# Patient Record
Sex: Male | Born: 1990 | Race: White | Marital: Single | Smoking: Never smoker
Health system: Southern US, Community
[De-identification: ages and names within clinical notes are randomized; demographics above are authoritative.]

## PROBLEM LIST (undated history)

## (undated) HISTORY — PX: TYMPANOSTOMY TUBE PLACEMENT: SHX32

---

## 2014-03-24 ENCOUNTER — Ambulatory Visit: Admit: 2014-03-24 | Discharge: 2014-03-24 | Disposition: A | Discharge status: Home / Self Care | Payer: Self-pay

## 2014-03-24 DIAGNOSIS — M25562 Pain in left knee: Secondary | ICD-10-CM

## 2014-03-24 LAB — HM HIV SCREENING OFFERED

## 2014-03-24 NOTE — UC Provider Note (Addendum)
History     Chief Complaint   Patient presents with    Knee Injury     Pt was rock climbing this evening and twisted left knee quickly causing a pop. Pt thinks that knee cap shifted while in the air, pt fell to ground and pt feels at that point the knee cap shifted back in place. Pain 4/10, no medication taken.       HPI Comments: 23 yo male c/o left knee pain s/p injury while climbing on rock wall at [employer].  Was reaching up while his knee was twisted, forced pressure onto his twisted knee, felt something pop, thinks his kneecap was out of place.  Larey SeatFell, landed on the ground, felt another pop.  Now knee is painful, hurts to bend.  Swollen.      Patient is a 23 y.o. male presenting with knee pain.   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:  Not work related  Knee Pain  Location:  Knee  Time since incident:  1 hour  Injury: yes    Mechanism of injury: fall    Fall:     Fall occurred:  Recreating/playing and jumping from height    Height of fall:  4 feet    Impact surface:  Athletic surface  Knee location:  L knee  Pain details:     Quality:  Aching    Radiates to:  Does not radiate    Severity:  Moderate    Onset quality:  Sudden    Timing:  Constant  Dislocation: yes (possible patella dislocation)    Foreign body present:  No foreign bodies  Prior injury to area:  No  Relieved by:  None tried  Worsened by:  Bearing weight and flexion  Associated symptoms: swelling    Associated symptoms: no fever        History reviewed. No pertinent past medical history.         Past Surgical History   Procedure Laterality Date    Tympanostomy tube placement         History reviewed. No pertinent family history.      Social History      reports that he has never smoked. He does not have any smokeless tobacco history on file. He reports that he drinks alcohol. He reports that he does not use illicit drugs. His sexual activity history is not on file.    Living Situation     Questions  Responses    Patient lives with Other(comment)    Comment: roomates     Homeless No    Caregiver for other family member No    External Services None    Employment Student    Domestic Violence Risk No          Review of Systems   Review of Systems   Constitutional: Negative for fever.   Eyes: Negative for discharge.   Respiratory: Negative for shortness of breath.    Cardiovascular: Negative for chest pain.   Musculoskeletal: Positive for joint swelling and arthralgias.   Skin: Positive for color change (bruising).   Neurological: Negative for dizziness.   Psychiatric/Behavioral: Negative for behavioral problems.       Physical Exam     ED Triage Vitals   BP Heart Rate Heart Rate(via Pulse Ox) Resp Temp Temp Source SpO2 O2 Device O2 Flow Rate   03/24/14 2038 03/24/14 2038 -- 03/24/14 2038 03/24/14 2038 03/24/14  2038 03/24/14 2038 03/24/14 2038 --   139/80 mmHg 77  18 36.4 C (97.6 F) Oral 97 % None (Room air)       Weight           03/24/14 2038           80.74 kg (178 lb)               Physical Exam   Constitutional: He is oriented to person, place, and time. He appears well-developed and well-nourished.   HENT:   Mouth/Throat: Oropharynx is clear and moist.   Eyes: Conjunctivae and EOM are normal.   Neck: Neck supple.   Cardiovascular: Normal rate and regular rhythm.    Pulmonary/Chest: Effort normal and breath sounds normal.   Musculoskeletal:        Left knee: He exhibits decreased range of motion (pain with flexion beyond 135 deg), swelling and effusion. He exhibits no deformity, no erythema, no LCL laxity, normal patellar mobility and no MCL laxity. Tenderness found. Medial joint line tenderness noted.   Lymphadenopathy:     He has no cervical adenopathy.   Neurological: He is alert and oriented to person, place, and time.   Skin: Skin is warm and dry.   Psychiatric: He has a normal mood and affect.     Xray show no acute fx or dislocation  Medical Decision Making        Initial Evaluation:  ED First Provider  Contact     Date/Time Event User Comments    03/24/14 2046 ED Provider First Contact Chaylee Ehrsam A Initial Face to Face Provider Contact          Patient seen by me as above    Assessment:  23 y.o., male [facility] with left knee pain s/p injury    Differential Diagnosis includes internal derangement, meniscus tear, patellar dislocation/reduction           Supervising physician Theophilus BonesKamali was immediately available     Plan: ice, ibuprofen, immobilization, crutches.  F/u with ortho.    Addendum: To clarify the medical record 03/30/2014 : Knee xray ordered due to pain and injury, results as above.  Knee immobilizer applied by nurse to left knee on 03/24/14, NVI post application.  Arlenis Blaydes A Jebadiah Imperato, PA

## 2014-03-24 NOTE — Discharge Instructions (Signed)
Rest your knee, ice, elevate, ibuprofen.  Keep immobilizer on until you follow up with ortho - you may take it off for bed and showers.  Follow up with orthopedics next week.

## 2014-03-24 NOTE — ED Notes (Signed)
Pt was rock climbing this evening and twisted left knee quickly causing a pop. Pt thinks that knee cap shifted while in the air, pt fell to ground and pt feels at that point the knee cap shifted back in place. Pain 4/10, no medication taken.  Devonne DoughtyMeghan Ester Hilley, RN

## 2014-03-25 ENCOUNTER — Ambulatory Visit: Payer: Self-pay | Admitting: Orthopedic Surgery

## 2014-03-25 ENCOUNTER — Encounter: Payer: Self-pay | Admitting: Orthopedic Surgery

## 2014-03-25 VITALS — Ht 71.0 in | Wt 178.0 lb

## 2014-03-25 DIAGNOSIS — S83005A Unspecified dislocation of left patella, initial encounter: Secondary | ICD-10-CM

## 2014-03-25 MED ORDER — HYDROCODONE-ACETAMINOPHEN 5-325 MG PO TABS *I*
1.0000 | ORAL_TABLET | Freq: Four times a day (QID) | ORAL | Status: DC | PRN
Start: 2014-03-25 — End: 2014-06-01

## 2014-03-25 NOTE — Progress Notes (Signed)
CHIEF COMPLAINT: Left knee injury    HISTORY OF PRESENT ILLNESS: Patient is a 23 year old otherwise healthy, active gentleman who presents today for evaluation of an injury to his left knee.  He states he was rockclimbing yesterday when he felt his left patella dislocate.  He did fall and felt his kneecap relocated.  He does not describe any previous problems with his left knee.  He was seen urgently and x-rays did reveal what appear to be ossific fragments in the patellofemoral compartment.  He was placed into a knee immobilizer, although he finds to be uncomfortable.  He has been taking ibuprofen with minimal symptomatic improvement.    PAST MEDICAL HISTORY: Denies any significant past medical history    PAST SURGICAL HISTORY: Tubes in the ears    MEDICATIONS:  See Medication List    ALLERGIES:  See Allergy List    SOCIAL HISTORY: He is a Consulting civil engineerstudent at [employer].  He does not smoke or use drugs.  He does drink alcohol    FAMILY HISTORY: Noncontributory    REVIEW OF SYSTEMS:  Review of Gastointestinal, Genitourinary, Neurologic, Integument, Vascular, Hematologic, Lymphatic, Cardiac, Pulmonary and Endocrine systems reveal the following: Swelling in multiple joints    PHYSICAL EXAM: His left knee today shows a small effusion.  There is tenderness in the peripatellar region.  There is no specific joint line tenderness.  I did not assess range of motion.  His calf is soft and nontender.  CMS checks are grossly intact distally.    IMAGING: I did review his x-rays.  These do show what appears to be ossific fragments in the anterior patellofemoral compartment consistent with an osteochondral injury from his patellar dislocation    ASSESSMENT AND DIAGNOSIS: Patellar dislocation with high suspicion for osteochondral injury    PLAN: Patient is referred for an MRI scan to better evaluate the osteochondral fragments.  She will followup shortly after the MRI.  He will call prior to that with any problems or questions.  He was given a  prescription for pain medication.  He'll continue with his ibuprofen, icing, and elevation.  He will continue with his knee immobilizer.  Hopefully we will expedite the MRI scan and see him back sometime next week for the results.  He will followup in the sports module.    ORDERS TODAY:    ORDERS NEXT VISIT:    PERCENT OF TEMPORARY IMPAIRMENT:

## 2014-03-25 NOTE — Patient Instructions (Signed)
Dear Zenia ResidesZachary Patrie,    Your physician has determined that you require durable medical equipment (DME) as a part of your treatment.  Knee braces, cast boots, walking boots, crutches, etc. Are DME.  These items offer protection and provide for your safety.  The type and quality of DME has been prescribed for you by your provider.      We cannot determine how much of the cost of this product will be paid by your insurance carrier.  Therefore, you may receive a bill for the outstanding balance.  It is your responsibility to pay whatever fee your insurance carrier does not.    DME Return Policy:     Braces and boots are not returnable if work outside of clinic due to hygiene concerns.   Poorly fitting braces can be exchanged for a correct fit within 1 week if the DME is in excellent condition.   DME that was not dispensed by [facility] will not be accepted.    If DME must be returned, it must be returned to the office that dispensed it.   Special order braces are billed at the time of order and are non-refundable.   Brace parts can be ordered and replaced if they become damaged or worn out.  This may include a charge.    Your type of brace: Other - 20 inch knee brace large     Patient Signature: __________________________  03/25/2014

## 2014-03-28 ENCOUNTER — Ambulatory Visit: Payer: Self-pay | Admitting: Sports Medicine

## 2014-03-28 ENCOUNTER — Encounter: Payer: Self-pay | Admitting: Orthopedic Surgery

## 2014-03-28 VITALS — BP 127/55 | Ht 71.0 in | Wt 178.0 lb

## 2014-03-28 DIAGNOSIS — T148XXA Other injury of unspecified body region, initial encounter: Secondary | ICD-10-CM

## 2014-03-28 NOTE — Patient Instructions (Signed)
Dear [name],    Your physician has determined that you require durable medical equipment (DME) as a part of your treatment.  Knee braces, cast boots, walking boots, crutches, etc. Are DME.  These items offer protection and provide for your safety.  The type and quality of DME has been prescribed for you by your provider.      We cannot determine how much of the cost of this product will be paid by your insurance carrier.  Therefore, you may receive a bill for the outstanding balance.  It is your responsibility to pay whatever fee your insurance carrier does not.    DME Return Policy:     Braces and boots are not returnable if work outside of clinic due to hygiene concerns.   Poorly fitting braces can be exchanged for a correct fit within 1 week if the DME is in excellent condition.   DME that was not dispensed by [facility] will not be accepted.    If DME must be returned, it must be returned to the office that dispensed it.   Special order braces are billed at the time of order and are non-refundable.   Brace parts can be ordered and replaced if they become damaged or worn out.  This may include a charge.    Your type of brace: T-ROM ($190)    Patient Signature: __________________________  03/28/2014

## 2014-03-28 NOTE — OR PreOp (Signed)
[facility] Pre-operative Guidelines              1. Where will you be staying when you are discharged after surgery-home  2.  How will you be getting there-girlfriend  3. Who will be taking care of you when you get there-girlfriend    Day of surgery arrival Time:  The surgery center will call you the business day before your procedure between 2-5 pm.     Eating and drinking guidelines:  No solid foods after midnight the night before your procedure.  Adults-  May have clear liquids (water, soda or apple juice only) up to 4 hours prior to your arrival time and then nothing by mouth.  Pediatrics-  All but dental patients may have clear liquids (water, soda, or apple juice only) up to 3 hours before arrival time.   Dental patients are NPO after midnight.  Breast fed infants may have breast milk up to 4 hours before arrival.  Formula fed infants may have formula up to 6 hours before arrival.    Medications:  Medications to be taken on the day of surgery:  none    Items to bring on the day of surgery:  Insurance card, photo ID and healthcare proxy.  If you use a CPAP for sleep apnea, please bring your mask with you.  Your crutches, knee brace or arm sling. Please leave your crutches in the car.    Clothing, Jewelry, valuables and eyeglass case:  Wear loose fitting clothing that will fit over a bulky dressing.  Leave all jewelry and valuables at home.  Bring your eyeglasses and eyeglass case. You cannot wear contact lenses.     Pediatrics:  A parent or legal guardian must accompany and remain in the building at all times. We recommend that two adults accompany the patient home while riding in a car; one to drive the vehicle and one to assist with the care of the child.   Bring the child’s favorite comfort item (i.e. blanket, doll or toy).    Surgeon Instructions:  Please read all pre-operative instructions carefully, and follow your surgeons guidelines if different from these instructions.

## 2014-03-28 NOTE — Progress Notes (Signed)
CHIEF COMPLAINT: Followup left knee    INTERVAL HISTORY: Patient returns now 3 days since his urgent care visit following his primary patellar dislocation.  There was clinical concern for an osteochondral fracture and he was referred for an MRI scan.  He returns today to discuss the results.  He indicates overall pain standpoint he is doing relatively well.  He is comfortable using crutches and a knee immobilizer.    PFSH:  Since last visit no change.    ROS:  Since last visit no change.    MEDICATION:  Since last visit no new meds.    ALLERGIES:  As per initial evaluation.    PHYSICAL EXAM: His left knee still shows soft tissue swelling and a small effusion.  Minimal tenderness at the joint lines.  His calf is soft and nontender.  CMS checks are grossly intact distally.    IMAGING: Dr. Milus Banisterouse and I did review the MRI scan.  This does show evidence of an osteochondral fracture involving large portion of the patellar articular cartilage and what appears to be an osteochondral injury to the periphery of the lateral femoral condyle.    ASSESSMENT: Primary patellar dislocation with osteochondral fragments    PLAN: Dr. Milus Banisterouse did meet with the patient.  Over 30 minutes was spent, the majority this was spent with education and counseling and reviewing the above.  Questions were invited and answered.  Patient understands no guarantees can be given as to the outcome.  At this point we'll plan for open reduction internal fixation of the fracture fragments from the patella.  We will plan for subsequent surgery for screw removal roughly 6 weeks following.  He will be in a TROM brace postoperatively.      ORDERS TODAY:    ORDERS NEXT VISIT:    PERCENT OF TEMPORARY IMPAIRMENT:

## 2014-03-31 NOTE — Progress Notes (Signed)
Jose Keller is seen and examined today in conjunction with Vladimir CreeksScott Kingsley for his left knee. History physical exam x-ray and MRI findings assessment and plan are as per his note. He is seen and MRI followup for her his first time patellar dislocations left knee.    Physical exam he does not have marked good patellar instability at risk anatomy. Q angle is 10 has minimal if any patella alter and has normal varus valgus alignment and does not have increased femoral anteversion. Does have medial facet tenderness. 2+ effusion. Flexion to 60 comfortably.    His MRI shows osteochondral and purely chondral fracturing involving central ridge and most of the medial facet of his patella. There's also some changes of the far rim of the lateral femoral condyle that is not articulate with the tibia in extension. Some of these defects or loose bodies some are still hinged full-thickness chondral flaps.    Impression extensive severe traumatic damage to the articular surfaces patella.    Plan long discussion was held with Ian MalkinZach and also his mother via cell phone regarding the severity of this injury. I've recommend acute surgical intervention which will consist of arthroscopic evaluation and removal of the loose bodies with plan for hopeful primary open reduction internal fixation of the pieces and flaps. 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 I discussed that any patellar dislocation is a risk factor for recurrence, but I do not feel his risk of patellar dislocation versus particularly high given his baseline anatomy as not bad. I discussed however that often indices patient's made to dislocate to have much more patellar trauma associated articular damage as is the case with him. He will require a second surgery remove the screws. I discussed in this interval we will try to minimize  patellofemoral contact pressure in a discussed biomechanics involved with that. Shows good understanding of all this. Anatomic models and drawings were used to explain the anatomy. I've added him on to the OR schedule this Friday. Risks benefits treatment options limitations reviewed. Questions invited and answered. Over 30 minutes was spent today with majority and education and counseling reviewing the above.

## 2014-03-31 NOTE — Anesthesia Preprocedure Evaluation (Addendum)
Anesthesia Pre-operative Evaluation for Jose Keller    ______________________________________________________________________________________  CPM Assessment Not Completed  Anesthesia Evaluation Information Source: records, patient     ANESTHESIA     Denies anesthesia history  Pertinent(-):  history of anesthetic complications    GENERAL     Denies general issues  Pertinent (-):  history of anesthetic complications    HEENT     Denies HEENT issues PULMONARY     Denies pulmonary issues  Pertinent(-): smoking, asthma, COPD    CARDIOVASCULAR     Denies cardiovascular issues  Excellent(10+METs) Exercise Tolerance  Pertinent(-):  hypertension    GI/HEPATIC/RENAL     Denies GI/hepatic/renal issues  Last PO Intake: Enter Last PO Intake in ROS/Med Hx Tab NEURO/PSYCH     Denies neuro/psych issues    ENDO/OTHER     Denies endo issues  Pertinent(-):  diabetes mellitus    HEMALOGIC     Denies hematologic issues       Physical Exam    Airway            Mouth opening: normal            Mallampati: I            TM distance (fb): >3 FB            Neck ROM: full            Airway Impression: easy  Dental   Normal Exam   Cardiovascular  Normal Exam           Rhythm: regular           Rate: normal       Pulmonary   Normal Exam    breath sounds clear to auscultation    Mental Status   Normal Exam    oriented to person, place and time     ________________________________________________________________________  Plan  ASA Score  1  Anesthetic Plan general    Induction (routine IV); General Anesthesia/Sedation Maintenance Plan (inhaled agents); Airway (LMA); Line ( use current access); Monitoring (standard ASA); Positioning (supine); PONV Plan (dexamethasone and ondansetron); Pain (per surgical team and nerve block); PostOp (PACU)    Informed Consent     Risks:          Risks discussed were commensurate with the plan listed above with the following specific points: N/V, sore throat and aspiration , damage to:(eyes, nerves, teeth),  allergic Rx, unexpected serious injury, awareness    Anesthetic Consent:      Anesthetic plan (and risks as noted above) were discussed with patient    Plan also discussed with team members including:  surgeon and CRNA    Attending Attestation:  As the primary attending anesthesiologist, I attest that the patient or proxy understands and accepts the risks and benefits of the anesthesia plan. I also attest that I have personally performed a pre-anesthetic examination and evaluation, and prescribed the anesthetic plan for this particular location within 48 hours prior to the anesthetic as documented.

## 2014-04-01 ENCOUNTER — Encounter: Payer: Self-pay | Admitting: Registered Nurse

## 2014-04-01 ENCOUNTER — Encounter: Payer: Self-pay | Admitting: Gastroenterology

## 2014-04-01 ENCOUNTER — Ambulatory Visit
Admit: 2014-04-01 | Discharge: 2014-04-01 | Disposition: A | Discharge status: Home / Self Care | Payer: Self-pay | Source: Ambulatory Visit | Attending: Sports Medicine | Admitting: Sports Medicine

## 2014-04-01 ENCOUNTER — Encounter
Disposition: A | Discharge status: Home / Self Care | Payer: Self-pay | Source: Ambulatory Visit | Attending: Sports Medicine

## 2014-04-01 HISTORY — PX: PR OPEN TX KNEE DISLOCATION W/REPAIR/RECONSTRUCTION: 27558

## 2014-04-01 HISTORY — PX: PR OPEN TX KNEE DISLOCATION W REPAIR/RECONSTRUCTION: 27558

## 2014-04-01 SURGERY — ORIF, PATELLA
Anesthesia: General | Site: Knee | Laterality: Left | Wound class: Clean

## 2014-04-01 MED ORDER — BUPIVACAINE-EPINEPHRINE 0.5 % IJ SOLUTION *WRAPPED*
INTRAMUSCULAR | Status: AC
Start: 2014-04-01 — End: 2014-04-01
  Filled 2014-04-01: qty 30

## 2014-04-01 MED ORDER — HALOPERIDOL LACTATE 5 MG/ML IJ SOLN *I*
1.0000 mg | Freq: Once | INTRAMUSCULAR | Status: DC | PRN
Start: 2014-04-01 — End: 2014-04-02
  Administered 2014-04-01: 1 mg via INTRAVENOUS

## 2014-04-01 MED ORDER — HYDROMORPHONE HCL PF 1 MG/ML IJ SOLN *WRAPPED*
0.5000 mg | INTRAMUSCULAR | Status: DC | PRN
Start: 2014-04-01 — End: 2014-04-02

## 2014-04-01 MED ORDER — KETOROLAC TROMETHAMINE 30 MG/ML IJ SOLN *I*
INTRAMUSCULAR | Status: DC | PRN
Start: 2014-04-01 — End: 2014-04-01
  Administered 2014-04-01: 30 mg via INTRAVENOUS

## 2014-04-01 MED ORDER — CEFAZOLIN SODIUM 1000 MG IJ SOLR *I*
2000.0000 mg | INTRAMUSCULAR | Status: AC
Start: 2014-04-01 — End: 2014-04-01
  Administered 2014-04-01: 2 g via INTRAVENOUS
  Administered 2014-04-01: 1 g via INTRAVENOUS

## 2014-04-01 MED ORDER — PROMETHAZINE HCL 25 MG/ML IJ SOLN *I*
6.2500 mg | INTRAMUSCULAR | Status: DC | PRN
Start: 2014-04-01 — End: 2014-04-02

## 2014-04-01 MED ORDER — HYDROMORPHONE HCL PF 1 MG/ML IJ SOLN *WRAPPED*
INTRAMUSCULAR | Status: AC
Start: 2014-04-01 — End: 2014-04-01
  Filled 2014-04-01: qty 1

## 2014-04-01 MED ORDER — ACETAMINOPHEN 10 MG/ML IV SOLN *I*
INTRAVENOUS | Status: DC | PRN
Start: 2014-04-01 — End: 2014-04-01
  Administered 2014-04-01: 1000 mg via INTRAVENOUS

## 2014-04-01 MED ORDER — LACTATED RINGERS IV SOLN *I*
20.0000 mL/h | INTRAVENOUS | Status: DC
Start: 2014-04-01 — End: 2014-04-02
  Administered 2014-04-01: 20 mL/h via INTRAVENOUS

## 2014-04-01 MED ORDER — ONDANSETRON HCL 2 MG/ML IV SOLN *I*
INTRAMUSCULAR | Status: AC
Start: 2014-04-01 — End: 2014-04-01
  Filled 2014-04-01: qty 2

## 2014-04-01 MED ORDER — DEXAMETHASONE SODIUM PHOSPHATE 4 MG/ML INJ SOLN *WRAPPED*
INTRAMUSCULAR | Status: DC | PRN
Start: 2014-04-01 — End: 2014-04-01
  Administered 2014-04-01: 4 mg via INTRAVENOUS

## 2014-04-01 MED ORDER — BUPIVACAINE-EPINEPHRINE 0.5 % IJ SOLUTION *WRAPPED*
INTRAMUSCULAR | Status: DC | PRN
Start: 2014-04-01 — End: 2014-04-01
  Administered 2014-04-01: 30 mL via PERINEURAL

## 2014-04-01 MED ORDER — KETOROLAC TROMETHAMINE 30 MG/ML IJ SOLN *I*
INTRAMUSCULAR | Status: AC
Start: 2014-04-01 — End: 2014-04-01
  Filled 2014-04-01: qty 1

## 2014-04-01 MED ORDER — MIDAZOLAM HCL 1 MG/ML IJ SOLN *I* WRAPPED
INTRAMUSCULAR | Status: DC | PRN
Start: 2014-04-01 — End: 2014-04-01
  Administered 2014-04-01: 2 mg via INTRAVENOUS

## 2014-04-01 MED ORDER — CEFAZOLIN SODIUM 1000 MG IJ SOLR *I*
INTRAMUSCULAR | Status: AC
Start: 2014-04-01 — End: 2014-04-01
  Filled 2014-04-01: qty 20

## 2014-04-01 MED ORDER — HYDROMORPHONE HCL PF 1 MG/ML IJ SOLN *WRAPPED*
INTRAMUSCULAR | Status: DC | PRN
Start: 2014-04-01 — End: 2014-04-01
  Administered 2014-04-01: 0.5 mg via INTRAVENOUS

## 2014-04-01 MED ORDER — LIDOCAINE HCL 1 % IJ SOLN *I*
INTRAMUSCULAR | Status: AC
Start: 2014-04-01 — End: 2014-04-01
  Filled 2014-04-01: qty 2

## 2014-04-01 MED ORDER — HALOPERIDOL LACTATE 5 MG/ML IJ SOLN *I*
INTRAMUSCULAR | Status: AC
Start: 2014-04-01 — End: 2014-04-01
  Filled 2014-04-01: qty 1

## 2014-04-01 MED ORDER — OXYCODONE HCL 5 MG/5ML PO SOLN *I*
10.0000 mg | Freq: Once | ORAL | Status: AC | PRN
Start: 2014-04-01 — End: 2014-04-01

## 2014-04-01 MED ORDER — ONDANSETRON HCL 2 MG/ML IV SOLN *I*
INTRAMUSCULAR | Status: DC | PRN
Start: 2014-04-01 — End: 2014-04-01
  Administered 2014-04-01: 4 mg via INTRAVENOUS

## 2014-04-01 MED ORDER — OXYCODONE HCL 5 MG/5ML PO SOLN *I*
5.0000 mg | Freq: Once | ORAL | Status: AC | PRN
Start: 2014-04-01 — End: 2014-04-01

## 2014-04-01 MED ORDER — FENTANYL CITRATE 50 MCG/ML IJ SOLN *WRAPPED*
INTRAMUSCULAR | Status: DC | PRN
Start: 2014-04-01 — End: 2014-04-01
  Administered 2014-04-01: 15:00:00 100 ug via INTRAVENOUS

## 2014-04-01 MED ORDER — BUPIVACAINE-EPINEPHRINE 0.25 % IJ SOLUTION *WRAPPED*
INTRAMUSCULAR | Status: DC | PRN
Start: 2014-04-01 — End: 2014-04-01
  Administered 2014-04-01: 30 mL via SUBCUTANEOUS

## 2014-04-01 MED ORDER — DEXAMETHASONE SODIUM PHOSPHATE 4 MG/ML INJ SOLN *WRAPPED*
INTRAMUSCULAR | Status: AC
Start: 2014-04-01 — End: 2014-04-01
  Filled 2014-04-01: qty 1

## 2014-04-01 MED ORDER — LIDOCAINE HCL 2 % (PF) IJ SOLN *I*
INTRAMUSCULAR | Status: AC
Start: 2014-04-01 — End: 2014-04-01
  Filled 2014-04-01: qty 4

## 2014-04-01 MED ORDER — PROPOFOL 10 MG/ML IV EMUL (INTERMITTENT DOSING) WRAPPED *I*
INTRAVENOUS | Status: DC | PRN
Start: 2014-04-01 — End: 2014-04-01
  Administered 2014-04-01: 230 mg via INTRAVENOUS

## 2014-04-01 MED ORDER — LIDOCAINE HCL 2 % IJ SOLN *I*
INTRAMUSCULAR | Status: DC | PRN
Start: 2014-04-01 — End: 2014-04-01
  Administered 2014-04-01: 80 mg via INTRAVENOUS

## 2014-04-01 MED ORDER — LIDOCAINE HCL 1 % IJ SOLN *I*
0.1000 mL | Freq: Once | INTRAMUSCULAR | Status: DC | PRN
Start: 2014-04-01 — End: 2014-04-02
  Administered 2014-04-01: 0.1 mL via SUBCUTANEOUS

## 2014-04-01 SURGICAL SUPPLY — 45 items
APPLICATOR CHLORAPREP 26ML ORANGE LARGE (Solution) ×4 IMPLANT
BANDAGE ELAST SLF-CLSR 4 X5.5 LF NONSTER (Dressing) ×2 IMPLANT
BANDAGE ELAST SLF-CLSR 6X11 LF NONSTER (Dressing) ×2 IMPLANT
BIT DRILL MINI QUICK COUPLING 1.5X65MM (Supply) ×4 IMPLANT
BLADE FULL RADIUS RESECTOR 3.5MM (Supply) ×2 IMPLANT
BLADE SURG CARBON STEEL #10 STER (Supply) ×4 IMPLANT
BLADE SURG CARBON STEEL #15 STER (Supply) ×8 IMPLANT
BLADE SURG CARBON STEEL #15 STER REUSE HNDL (Supply) ×4 IMPLANT
BOOT KNEE HIGH NON SKID (Supply) ×2 IMPLANT
BRACE BREG POST-OP KNEE SHT (Supply) IMPLANT
BUR CARBIDE MED RND 5MM LF (Other) IMPLANT
BUR CARBIDE RND 3MM LF (Other) IMPLANT
CLOSURE STERI-STRIP REINF .5 X 4IN LF (Dressing) ×2 IMPLANT
COUNTER NEEDLE ADH 20/40FM BLACK STER (Supply) IMPLANT
COVER C ARM W104XL213CM PNL 76X61CM FTSWCH W36XL76CM MINI FOR OEC 6800 (Drape) IMPLANT
COVER CAMERA LIGHT HANDLE DISP (Supply) ×2 IMPLANT
CUFF TOURNIQUET SPSB PLC 34IN STER DISP (Supply) ×2 IMPLANT
DRAPE C-ARM 42 IN X 120 IN S1 (Drape) IMPLANT
DRAPE MINI C-ARM (Drape)
DRAPE SHEET 70X100 (Drape) ×1
DRAPE SUR W70XL100IN STD SMS POLYPR FULL SHT W/O FLD PCH DISP (Drape) ×1 IMPLANT
DRESSING XEROFORM 5 IN X9 IN (Dressing) ×2 IMPLANT
GLOVE SURG PROTEXIS PI CLASSIC 8.5 PF SYN (Glove) ×2 IMPLANT
GLOVE SURG PROTEXIS PI CLASSIC 9.0 PF SYN (Glove) ×2 IMPLANT
GOWN SIRIUS PLY REINF BRTH SLV XL XLONG (Gown) ×4 IMPLANT
GOWN SIRIUS RAGLAN NONREINFORCED XL  USE 188665 (Gown) IMPLANT
KIT SKIN SCRUB (Pack) ×1
MAT SUCT FLOOR PUDDLE GUPPY (Supply) ×2 IMPLANT
PACK CUSTOM ACL CDS (Pack) ×2 IMPLANT
PADDING WEBRIL 4IN LF NONSTER (Dressing) ×2 IMPLANT
SCREW CORTEX SLF-TAP CRUCIFORM 2.0 14MM (Screw) ×8 IMPLANT
SET ARTHRSC CASSETTE TUBE (Supply) ×2 IMPLANT
SLEEVE COMP KNEE HI MED (Supply) ×2 IMPLANT
SOL LACT RINGER IRRIG 3000ML BAG (Drug) ×4 IMPLANT
SOL SODIUM CHLORIDE IRRIG 1000ML BTL (Solution) ×2 IMPLANT
SUTR ETHILON 4-0 PS-2 BLACK (Suture) IMPLANT
SUTR ETHILON MONO 3-0 PS-2 BLACK (Suture) IMPLANT
SUTR VICRYL ANTIB 1 CT-1 POP 18 VIOLET (Suture) ×2 IMPLANT
SUTR VICRYL ANTIB BRD 2-0 CP-2 18 UNDYED (Suture) ×4 IMPLANT
TIP SUCT FRAZIER VENT 10FR (Supply) ×2 IMPLANT
TRAY SCRUB DRY SKIN INCLUDES 6 WINGED SPONGES 6 SPONGE STICKS 2 COTTON TIP APPLICATORS ABSORBENT/BLOTTING TOWELS PREMIUM (Pack) ×1 IMPLANT
TUBING NONCONDUC CONN 12FT X 3/16IN (Tubing) ×2 IMPLANT
WAND ELIM WITH CABLE 4.5 X 90DEG (Supply) IMPLANT
WIRE K DBL TROCAR END SHT SMTH .028X4IN (Implant) ×2 IMPLANT
screw synthes 2.0mm cortex ×2 IMPLANT

## 2014-04-01 NOTE — Anesthesia Procedure Notes (Addendum)
----------------------------------------------------------------------------------------------------------------------------------------    Femoral Nerve Block    Date of Procedure: 04/01/2014 3:08 PM    Laterality:  Left    Injection Technique: Single-shot    Reason for Block: Post-op pain management and At Surgeon's request        Patient Location: Pre-op  CONSENT AND TIMEOUT     Consent:  Obtained per policy    Timeout: patient identified (name/DOB) , nerve block procedure/site/side verified against block consent form , nerve block procedure/site/side verified by patient or family, proper patient position verified, operative procedure/site/side verified by patient or family , block site initialed by attending, needed equipment, monitors, medications and access verified as present and functioning , operative procedure/side/site verified against surgical consent, allergies reviewed with patient/record , operative procedure/side/site verified against surgical schedule, all members of the block team participated in the timeout and anticoagulation/antiplatelet status reviewed  METHOD     Patient Position:  Supine    Monitoring:  Blood pressure and Continuous pulse ox with supplemental oxygen    Sedation Used:  Yes    Level of Sedation: Minimal              for meds injected see MAR portion of chart    Prep:  Aseptic technique per protocol and Chloraprep    Approach:  In-plane and Lateral    Technique: Ultrasound guided       Attempts:  1   NEEDLE     Type:  Short-bevel     Gauge: 22 G     Length: 5 cm  BLOCK EVENTS      No Paresthesia with needle     No Paresthesia with injection     No significant resistance to injection     No significant pain on injection     No blood aspirated     No intravascular injection     Well Tolerated  STAFF     Performed by: Extender under direct supervision    Attending Attestation: I was present for the entire procedure     Attending: Marcille BuffyYU, ALBERT  Extender: FICHTER,  JENNIFER  ----------------------------------------------------------------------------------------------------------------------------------------  ---------------------------------------------------------------------------------------------------------------------------------------  AIRWAY   GENERAL INFORMATION AND STAFF    Patient location during procedure: OR       Date of Procedure: 04/01/2014 3:52 PM  CONDITION PRIOR TO MANIPULATION     Current Airway/Neck Condition:  Normal        For more airway physical exam details, see Anesthesia PreOp Evaluation  AIRWAY METHOD     Preoxygenated: yes      Induction: IV  Mask Difficulty Assessment:  0 - not attempted    Number of Attempts at Approach:  1  FINAL AIRWAY DETAILS    Final Airway Type:  LMA    Final LMA: Unique    LMA Size: 4  ADDITIONAL COMMENTS   Soft bite block in.  ----------------------------------------------------------------------------------------------------------------------------------------

## 2014-04-01 NOTE — Discharge Instructions (Signed)
ACL Repair  Dr. Reginia FortsLucian Rouse  Department of Orthopedics  Phone - [phone]    Please follow the instructions next to any blank that is checked [x] .    ACTIVITY:  [x]  Rest today; tomorrow you may resume your usual activities if you received anesthesia care.  []  Use crutches, weight bearing as tolerated until comfortable then discontinue; bend and straighten your knee as tolerated  []  No weight bearing - use crutches.  [x]  Touch down weight bearing - use crutches (25% partial weight bearing with brace locked).    MEDICATIONS:  Resume your usual medication.  If you are taking prescribed pain medication, you should not drive, operate machinery, power tools, or drink any alcoholic beverages.     DRESSINGS:    [x]  Keep your dressing clean and dry until you see your doctor (first dressing change to be done by Physical Therapist).  []  Remove your dressing after 48 hours, then place Band-Aids over the incision.   [x]  Use an ice bag over your incision site for 24 hours.  Keep a washcloth between ice bag and cast/bandage to keep it dry.    SPECIAL INSTRUCTIONS:  [x]  You may take a shower after 1 week.  Do not soak in the tub, pool etc.  [x]  Remove drain 4 hours after discharge.  [x]  You may drive when your doctor tells you.    SURGERY TO A LIMB:  Keep limb elevated on 2-3 pillows so that it is above the level of your heart for 24 hours intermittently.  Wiggle toes of affected limb continually.    YOU SHOULD CALL YOUR DOCTOR FOR ANY OF THE FOLLOWING:  Fever of 101? or higher.  Redness, warmth and firmness around the incision.  Foul smelling drainage from incision or cast.  Pain that does not lessen with pain medication as prescribed by your doctor.  Persistent nausea or vomiting into the next day.  Bleeding or continuous oozing that saturates the bandage that does not stop after applying pressure to wound for 0 minutes.  Increased swelling of fingers, or severe tightness of bandage not relieved with elevation of  limb above the level of your heart.  Increased numbness or tingling.  Pale, blue or cold fingers/nail beds (compared to opposite side).  If you have not urinated within 6 hours after discharge.    Work/ School: Discuss with doctor at post -op visit.    Follow up care:  You should already have an appointment for your post operative visit at the doctors office.   If you do not, please call ASAP [phone]    In case of emergency in which you cannot reach your physician, please go directly to the nearest hospital emergency room department.          Due to the sedation medication and or general anesthesia you have been given today, please follow these discharge instructions:    [x]  Do not drive or operate any machinery for 24 hours or the specified time frame that was recommended by your doctor, please refer to post op instructions.  [x]  Do not drink any alcoholic beverages for 24 hours after your procedure and/or if you are taking a narcotic pain reliever (e.g. Vicodin, Percocet or Tylenol #3).  [x]  Do not make any major decisions or sign contracts for 24 hours.  [x]  Prescription information given to patient and/or patient representative.    Diet: begin with liquids, advance as tolerated.    Your last pain medication was given to you  at: Tylenol given @ 3:30pm                                                                              Toradol given @ 5:30pm    Your next dose of pain medication is due after: May have Toradol @ 11:30pm                                                                              [facility] @ 9:30pm    If unable to reach your doctor at [phone], call 911 for a true emergency or go to your nearest emergency department.    It is our recommendation that you have someone stay with you for 24 hours following your procedure.

## 2014-04-01 NOTE — H&P (Signed)
UPDATES TO PATIENT'S CONDITION on the DAY OF SURGERY/PROCEDURE    I. Updates to Patient's Condition (to be completed by a provider privileged to complete a H&P, following reassessment of the patient by the provider):    (Inpatients only): I confirm that progress notes within the past 24 hours document updates to the patient's condition.            II. Procedure Readiness   I have reviewed the patient's H&P and updated condition. By completing and signing this form, I attest that this patient is ready for surgery/procedure.      III. Attestation   I have reviewed the updated information regarding the patient's condition and it is appropriate to proceed with the planned surgery/procedure.    Norina BuzzardLUCIEN Robbert Langlinais, MD as of 3:05 PM 04/01/2014

## 2014-04-01 NOTE — Anesthesia Case Conclusion (Signed)
CASE CONCLUSION  Emergence  Actions:  Suctioned, soft bite block and LMA removed  Criteria Used for Airway Removal:  Adequate Tv & RR and acceptable O2 saturation  Assessment:  Routine  Transport  Directly to: PACU  Position:  Recumbent  Patient Condition on Handoff  Level of Consciousness:  Mildly sedated  Patient Condition:  Stable  Handoff Report to:  RN

## 2014-04-01 NOTE — INTERIM OP NOTE (Signed)
Interim Op Note (Surgical Log ID: [id number])       Date of Surgery: 04/01/2014       Surgeons: Surgeon(s) and Role:     Norina Buzzard* Rouse, Lucien, MD - Primary     * Nimrat Woolworth, Alphonzo DublinJon P, MD - Resident - Assisting       Pre-op Diagnosis: Pre-Op Diagnosis Codes:     * Dislocation of patella, left, subsequent encounter [S83.005D]     * Bodies, loose, joint, knee, left [M23.42]       Post-op Diagnosis: Post-Op Diagnosis Codes:     * Dislocation of patella, left, subsequent encounter [S83.005D]     * Bodies, loose, joint, knee, left [M23.42]       Procedure(s) Performed: Procedure:    LEFT KNEE ARTHROSCOPY AND OPEN REDUCTION INTERNAL FIXATION OF LEFT PATELLA  CPT(R) Code:  [id number] - PR OPEN TX KNEE DISLOCATION W REPAIR/RECONSTRUCTION       Additional CPT Codes:        Anesthesia Type: General        Fluid Totals: I/O this shift:  10/23 1500 - 10/23 2259  In: 1000 (12.6 mL/kg) [I.V.:1000]  Out: - (0 mL/kg)   Net: 1000  Weight: 79.4 kg        Estimated Blood Loss: No Data Recorded       Specimens to Pathology:  * No specimens in log *       Temporary Implants:        Packing:                 Patient Condition: good       Findings (Including unexpected complications): none     Signed:  Barrington EllisonJon P Lielle Vandervort, MD  on 04/01/2014 at 5:21 PM

## 2014-04-01 NOTE — Anesthesia Postprocedure Evaluation (Signed)
Anesthesia Post-Op Note    Patient: Jose ResidesZachary Keller    Procedure(s) Performed:  Procedure Summary     Date Anesthesia Start Anesthesia Stop Room / Location    04/01/14 1508 1807 [id number]       Procedure Diagnosis Surgeon Attending Anesthesia    LEFT KNEE ARTHROSCOPY with loose body removal AND OPEN REDUCTION INTERNAL FIXATION OF LEFT PATELLA (Left Knee) Dislocation of patella, left, subsequent encounter; Bodies, loose, joint, knee, left Norina Buzzardouse, Lucien, MD Lovena Neighbours, Lorraina  I, MD     (Dislocation of patella, left, subsequent encounter [S83.005D]; Bodies, loose, joint, knee, left [M23.42])          Anesthesia type:  General  Complications Noted (Any):  None No value filed.  Patient Location:  PACU  Level of Consciousness:    Recovered to baseline  Patient Participation:     Able to participate  Temperature Status:    Normothermic  Oxygen Saturation:    Within patient's normal range  Cardiac Status:   Within patient's normal range  Fluid Status:    Stable  Airway Patency:     Yes  Pulmonary Status:    Baseline  Pain Management:    Adequate analgesia  Nausea and Vomiting:    Controlled    Post Op Assessment:    Tolerated procedure well and no evidence of recall   Attending Attestation:  All indicated post anesthesia care provided

## 2014-04-02 ENCOUNTER — Telehealth: Payer: Self-pay | Admitting: Orthopedic Surgery

## 2014-04-02 NOTE — Telephone Encounter (Signed)
Patient called answering service concerned about his stitches. Now POD1 s/p left knee arthroscopy with fixation of a loose osteochondral patellar fragment after patellar dislocation. States that his stitches feel "tight" and that they may be pulling apart. Denies any significant increase in pain. Denies strikethrough drainage on his bandage. Reassured patient that what he is feeling may be normal postoperatively but should he develop any increase in drainage or substantial increase in pain that he present to ED or urgent care over the weekend, otherwise he can call Dr. Coralie Carpenouse's office on Monday to discuss any concerns. All patient's questions/concerns addressed to his satisfaction.    Jose ConnorsNathan Jacaria Colburn, MD  Orthopaedic Surgery  04/02/2014, 9:13 PM

## 2014-04-04 NOTE — Op Note (Signed)
Jose Keller:   Keller, Jose  MR #:  [id number]   ACCOUNT #:  [id number] DOB:  01/06/1991    AGE:  23     SURGEON:  Kristine LineaLucien M Brittain Hosie, MD  CO-SURGEON:    ASSISTANT:    SURGERY DATE:  04/01/2014    PREOPERATIVE DIAGNOSIS:    1. 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   2. Posttraumatic arthritis and loose bodies, left knee.    OPERATIVE PROCEDURE:    1. Open reduction and internal fixation of osteochondral fracture of patella (CPT [phone]).   2. Surgical arthroscopy with chondroplasty and debridement and removal of loose body (CPT [phone]).    ANESTHESIA:  General plus single-shot femoral nerve block.    COMPLICATIONS:  None.    OPERATIVE INDICATIONS:  This is a 23 year old male who sustained a patellar dislocation of his left knee.  MRI showed extensive articular damage to the patella with a large, hinged, fully chondral flap full-thickness but also associated osteochondral defect and associated loose bodies.  There is also a defect of the far lateral flare of the lateral femoral condyle.  Preoperatively, extensive consultation was held with the patient regarding risks, benefits, treatment options, and limitations.  We discussed that this does represent significant articular damage to the patella and that complete normalcy of this surface will not be obtained.  The plan is for open reduction and internal fixation of those as able given the pieces.  The plan will be for then removal of the screws after approximately 10 weeks postop.  Again, risks, benefits, treatment options, and limitations were reviewed.  Questions were invited and answered.  He desires we proceed.    DESCRIPTION OF PROCEDURE:  After appropriate consent was obtained, the patient was taken to the operating room, where excellent general anesthesia was administered.  The patient also had a single-shot femoral nerve block placed in the preanesthesia unit for  postoperative pain control.  The patient remained stable throughout the case.     The patient's left knee was examined under anesthesia.  There was found to be no ligamentous instability.     The patient's left lower extremity was then prepped and draped in the usual sterile manner.  A superomedial outflow portal was created, then anterolateral and anteromedial portals created.  Arthroscopic findings were as follows:  The suprapatellar pouch, medial and lateral gutters were without abnormalities.  The patellofemoral joint showed fairly mild lateral tracking and then became fully centered at 40 degrees in spite of his recent patellar dislocation.  There was again extensive damage to the patella.  The distal 2/3 of the medial facet was involved across the central ridge into the lateral facet.  There were multiple fragments of full-thickness chondral flaps and then also a large piece of this partly chondral/partly osteochondral completely missing as part of the loose body.  This loose body was identified and gently removed by enlarging the portal.  There was also associated grade 4 defect of the lateral femoral condyle, which just barely started to articulate in the posterior edge of the defect with the anterior rim of the lateral meniscus in full extension, thus not a major weightbearing portion, indicating the dislocation having occurred in not deep flexion.     The grossly unstable flaps of the lateral femoral condyle and non-repairable flaps of the patella were then debrided with a 3.5 full radius.     A medial parapatellar  incision was then made, and the distal quad medial retinaculum down to the medial border of the patellar tendon was incised to allow for the patella to be everted for exposure of the defect.  Again, there were multiple longitudinal fracture lines in the cartilage with a large flap that was full-thickness but further delamination partial-thickness shearing more proximally.  This part was  debrided.  The base was curetted to further stimulate healing.  The loose body was found to fit the more distal aspect of the defect.  Approximately half of this loose body had some bone.  Both the full-thickness chondral flap and the loose body were then carefully sized and fit, and then fixed with 2.0 mm screws from the [employer] hand modular pan.  This involved a 1.5 drill and 2.0 screws.  Three were placed along the primary central ridge fragment and two over the more secondary chondral fragment.  One of the medial facet screws was 11 mm; the others were 14 mm.  Good fixation and reduction was obtained.  These were all countersunk and did not create crepitation against the trochlea with range of motion including simulated compression.  The final irrigation was performed and then the medial arthrotomy repaired using #1 Vicryl.  The skin was closed with 2-0 Vicryl for subcu and a running subcuticular suture of 3-0 Monocryl for skin.  Sterile dressing was applied and the patient placed in a hinged knee brace locked in full extension.  The patient tolerated the procedure well and was taken to the recovery room in stable condition.             ______________________________  Kristine LineaLucien M Marranda Arakelian, MD    LMR/MODL  DD:  04/04/2014 07:33:59  DT:  04/04/2014 10:53:10  Job #:  [id number]    cc:

## 2014-04-05 ENCOUNTER — Ambulatory Visit: Payer: Self-pay | Admitting: Physical Therapy

## 2014-04-05 ENCOUNTER — Encounter: Payer: Self-pay | Admitting: Sports Medicine

## 2014-04-05 DIAGNOSIS — M25562 Pain in left knee: Secondary | ICD-10-CM

## 2014-04-05 NOTE — Progress Notes (Signed)
SPORTS PT CHARGE:   Evaluation:  PT CODE 3302 - CPT CODE PT - 97001 - Initial Eval (30 minutes)  Supervised Modalities:  PT CODE 203 - CPT CODE PT - 97016 -  Vasopneumatic devices  Therapeutic Procedures:  PT CODE 314 - CPT CODE PT - 97110 - Therapeutic Exercise (ea 15 min) x 1 unit  Total Minute Time:  60 min

## 2014-04-05 NOTE — Progress Notes (Signed)
[facility]   POST-OPERATIVE KNEE EVALUATION    Diagnosis: Left  patellar OCD fx ORIF    Date of Surgery: 04/01/14    History  Jose Keller is a 23 y.o. male who is present today for left knee care s/p left knee patellar OCD fx ORIF.  Pt. Reports that he was rock climbing when he felt his left patella dislocate.  He fell and landed directly on his knee  Pt reports pain is moderate.  Symptom location: Anterior, left  Relevant symptoms:  Aching, Pain , Decreased ROM, Decreased strength  Symptom frequency: Intermittent  Symptom intensity:  (0 - 10 scale): Now 5 Best 0 Worst 7   Night Pain: Yes   Restful sleep:   Yes  Morning Pain/Stiffness: Unchanged   Symptoms worsen with: NA  Symptoms improve with: Rest, Medication  Assistive device:  crutches  Patients goals for therapy: Return to work, Return to sport, Reduce pain, Increase ROM, Increase strength, Independent with home program  Post-op note reviewed: Yes    Occupation and Activities  Work status: Usual work  Therapist, 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, [employer]  Chiropractortresses/physical demands of job: Nurse, learning disabilityKeyboarding and Naval architectWriting  Stresses/physical demands of home: Self Care, Stage managerHousekeeping, Gardening/Yard Work and Sports  Sport(s): rock climbing       Objective    Observation: Edema, Effusion  Gait:  NWB     Lumbar Screen:  WNL  Neurologic:  NA    Palpation: swelling and tenderness  Incision:  No evidence of infection      ROM/Strength  Full strength/ROM assessment deferred secondary to acute post-op status    KNEE LEFT RIGHT STRENGTH    PROM AROM PROM AROM Left Right   Flexion 70    3 5   Extension 0    3 5            Quad Isometric (Quad set)     Poor Good   SLR     NA Good         Special Tests:   Homans: negative  Remainder of LE special tests deferred secondary to acute post-op status        Functional:  Perform self-care activities/basic ADLs - unable to perform.  Walk more than 30 minutes - unable to perform.  Ascend stairs with reciprocal gait - unable to  perform.  Descend stairs with reciprocal gait - unable to perform.  Return to sport/activities - unable to perform.    Assessment:    Findings consistent with 23 y.o., male with patellar OCD fx ORIF with pain, swelling, ROM limitations, strength limitations, functional limitations    Prognosis:  Good   Contraindications/Precautions/Limitation:  no active knee ext; PWB  Short Term Goals: (1 week(s)): Minimal assistance with HEP/ education concepts  Long Term Goals: (3 month(s)): Pain/Sx 0 - minimal, ROM/ flexibility WNL , Restoration of functional strength, Independent with HEP/education , Functional return to ADLs / activites without limitation     Treatment Plan:   Options / plan reviewed with patient:  Yes  Freq 1-2 times per week for 3 month(s)    Treatment plan inclusive of:   Exercise: AROM, AAROM, PROM, Stretching   Manual Techniques:  N/A   Modalities:  Cryotherapy, Ther Exercise per flowsheet, Vasopneumatic Treatment   Functional: Functional rehab    [facility].    Krystal EatonAdam Raeya Merritts, PT    DOS: 04/01/14   WEIGHTBEARING STATUS partial  Towel calf/HS stretch 10sec x 10    Quad set  10x    Seated knee flex + ext PROM 10x

## 2014-04-07 ENCOUNTER — Ambulatory Visit: Payer: Self-pay | Admitting: Orthopedic Surgery

## 2014-04-07 ENCOUNTER — Encounter: Payer: Self-pay | Admitting: Orthopedic Surgery

## 2014-04-07 VITALS — BP 140/64 | Ht 71.0 in | Wt 178.0 lb

## 2014-04-07 DIAGNOSIS — T148XXA Other injury of unspecified body region, initial encounter: Secondary | ICD-10-CM

## 2014-04-07 NOTE — Progress Notes (Signed)
CHIEF COMPLAINT: Postop check    INTERVAL HISTORY: Patient returns for only following this surgery for has osteochondral fractures following his patellar dislocation.  He indicates he is doing very well.  He has already started physical therapy.  He has weaned off of his narcotic pain medication and is taking only ibuprofen.    PFSH:  Since last visit no change.    ROS:  Since last visit no change.    MEDICATION:  Since last visit no new meds.    ALLERGIES:  As per initial evaluation.    PHYSICAL EXAM: His incisions are benign.  No surrounding erythema or other evidence of infection.  Small effusion.  His calf is soft and nontender.  CMS checks are grossly intact distally.    IMAGING:    ASSESSMENT: Excellent postoperative course    PLAN: He will continue with his postoperative protocol.  He'll continue with physical therapy, icing, elevation, and anti-inflammatories.  Followup in 2-3 weeks.  He will call prior to that appointment with any problems or questions.  Sutures were removed and replaced with Steri-Strips.    ORDERS TODAY:    ORDERS NEXT VISIT:    PERCENT OF TEMPORARY IMPAIRMENT:

## 2014-04-15 ENCOUNTER — Ambulatory Visit: Payer: Self-pay | Admitting: Physical Therapy

## 2014-04-15 DIAGNOSIS — M25562 Pain in left knee: Secondary | ICD-10-CM

## 2014-04-15 NOTE — Progress Notes (Signed)
SPORTS PT CHARGE:   Supervised Modalities:  PT CODE 203 - CPT CODE PT - 97016 -  Vasopneumatic devices  Therapeutic Procedures:  PT CODE 314 - CPT CODE PT - 97110 - Therapeutic Exercise (ea 15 min) x 1 unit  PT CODE 315 - CPT CODE PT - [id number] -  Neuromuscular Re-Educ (ea 15 minutes) x 1 unit  Total Minute Time:  45 min

## 2014-04-15 NOTE — Progress Notes (Signed)
[facility]  PT Note    Jose ResidesZachary Keller   [id number]     Diagnosis: Left knee patellar OCD fx ORIF    Subjective:  Pain Score:  3  Pain: Improved, pt. reports significant improvement with pain level since the last visit.  he has been able to perform his ROM exercises with less pain.     Objective:  ROM - Improved, Left, Knee, FLEX 70  Strength - Improved,  Per Ther Ex flowsheet  Function: - pt. maintaining PWB status  Education:  Updated HEP, Verbal cues for ther ex    Objective        Towel calf + HS stretch 10sec x 10    Quad set   EMG 2x10   SLR 2x5   Seated knee flex + ext 20x                                                    Treatment:  Cryotherapy, Ther Exercise per flowsheet, Vasopneumatic Treatment    Assessment:  Patient responding favorably   Pt. Demonstrates improvements with quad control and ROM today.         Plan of Care:  Continue per Plan of care -  As written    [facility]    Krystal EatonAdam Lakishia Bourassa, PT

## 2014-04-25 ENCOUNTER — Encounter: Payer: Self-pay | Admitting: Orthopedic Surgery

## 2014-04-25 ENCOUNTER — Ambulatory Visit: Payer: Self-pay | Admitting: Orthopedic Surgery

## 2014-04-25 VITALS — BP 148/64 | Ht 71.0 in | Wt 172.0 lb

## 2014-04-25 DIAGNOSIS — S83005D Unspecified dislocation of left patella, subsequent encounter: Secondary | ICD-10-CM

## 2014-04-25 NOTE — Progress Notes (Signed)
CHIEF COMPLAINT: Postop check    INTERVAL HISTORY: Patient is just over 3 weeks following his ORIF of his left knee involving osteochondral fragments as a result of his patellar dislocation.  He continues to make good progress with physical therapy.  Patient states the pain is under good control.  He is doing well leg-assisted leg extension exercises.  He is also doing quad sets with his knee extended.    PFSH:  Since last visit no change.    ROS:  Since last visit no change.    MEDICATION:  Since last visit no new meds.    ALLERGIES:  As per initial evaluation.    PHYSICAL EXAM: His left knee incision is healing nicely.  He does have a small effusion.  No increased warmth or erythema.  Well leg-assisted he can extend his knee to 0.  Passive flexion to 90.  His calf is soft and nontender.  CMS checks are grossly intact distally.    IMAGING:    ASSESSMENT: Good postoperative course    PLAN: Dr. Milus Banisterouse did see and examine the patient today as well.  He will continue with his physical therapy exercises including well leg-assisted leg extension in addition to leg less in his brace.  He is scheduled to undergo screw removal next month.  He will followup at that point.  He will contact us prior to that with any problems or questions.      ORDERS TODAY:    ORDERS NEXT VISIT:    PERCENT OF TEMPORARY IMPAIRMENT:

## 2014-05-12 ENCOUNTER — Ambulatory Visit: Payer: Self-pay | Admitting: Physical Therapy

## 2014-05-12 DIAGNOSIS — M25562 Pain in left knee: Secondary | ICD-10-CM

## 2014-05-12 NOTE — Progress Notes (Signed)
SPORTS PT CHARGE:   Therapeutic Procedures:  PT CODE 314 - CPT CODE PT - 97110 - Therapeutic Exercise (ea 15 min) x 2 units  PT CODE 315 - CPT CODE PT - 97112 -  Neuromuscular Re-Educ (ea 15 minutes) x 1 unit  Total Minute Time:  45 min

## 2014-05-12 NOTE — Progress Notes (Signed)
[facility]  PT Note    Zenia ResidesZachary Boggess   [id number]     Diagnosis: Left knee patellar OCD fx ORIF    Subjective:  Pain Score:  3  Pain: Improved, pt. reports significant improvement with pain level since the last visit.  he has been able to perform his ROM exercises with less pain.     Objective:  ROM - Improved, Left, Knee, FLEX 80  Strength - Improved,  Per Ther Ex flowsheet  Function: - Improved, pt. FWB with brace locked  Education:  Updated HEP, Verbal cues for ther ex    Objective        Towel calf + HS stretch 10sec x 10    Quad set   EMG 3x10   SLR  EMG 3x10   Seated knee flex + ext 20x                                                    Treatment:  Cryotherapy, Ther Exercise per flowsheet, Vasopneumatic Treatment    Assessment:  Patient responding favorably   Pt. Demonstrates significant progress with quad control during SLR today.  He is scheduled to have surgery in ~ 2 wks and he will be returning home for winter break from school.  He will f/u at this clinic when he returns for his spring semester.         Plan of Care:  Continue per Plan of care -  As written    [facility]    Krystal EatonAdam Lianny Molter, PT

## 2014-05-18 NOTE — OR PreOp (Signed)
[facility] Pre-operative Guidelines        1. Where will you be staying when you are discharged after surgery: [employer] student, unsure of ride but mom "coming up", possibly will ride to [facility] postop to go home with mom  2.  How will you be getting there: possibly mom  3. Who will be taking care of you when you get there: mom. Writer informed pt of the need for help after surgery, pt verbalized unserstanding    Day of surgery arrival Time:  The surgery center will call you the business day before your procedure between 3 and 4:30pm.     Eating and drinking guidelines:  No solid foods after midnight the night before your procedure.  Adults-  May have clear liquids (water, soda or apple juice only) up to 4 hours prior to your arrival time and then nothing by mouth.  Pediatrics-  All but dental patients may have clear liquids (water, soda, or apple juice only) up to 3 hours before arrival time.   Dental patients are NPO after midnight.  Breast fed infants may have breast milk up to 4 hours before arrival.  Formula fed infants may have formula up to 6 hours before arrival.    Medications:  Medications to be taken on the day of surgery: tylenol if needed. HOLD vits/supp/nsaids/asa x5 days preop    Items to bring on the day of surgery:  Insurance card, photo ID and healthcare proxy.  If you use a CPAP for sleep apnea, please bring your mask with you.  Your crutches, knee brace or arm sling. Please leave your crutches in the car.    Clothing, Jewelry, valuables and eyeglass case:  Wear loose fitting clothing that will fit over a bulky dressing.  Leave all jewelry and valuables at home.  Bring your eyeglasses and eyeglass case. You cannot wear contact lenses.     Pediatrics:  A parent or legal guardian must accompany and remain in the building at all times. We recommend that two adults accompany the patient home while riding in a car; one to drive the vehicle and one to assist with the care of the child.   Bring  the childs favorite comfort item (i.e. blanket, doll or toy).    Surgeon Instructions:  Please read all pre-operative instructions carefully, and follow your surgeons guidelines if different from these instructions.

## 2014-05-31 NOTE — Anesthesia Preprocedure Evaluation (Addendum)
Anesthesia Pre-operative Evaluation for Jose ResidesZachary Keller    ______________________________________________________________________________________  CPM Assessment Not Completed  Anesthesia Evaluation Information Source: patient, family, records     ANESTHESIA    + History of anesthetic complications            PONV  Pertinent(-):  Family Hx of Anesthetic Complications    GENERAL  Pertinent (-):  Family Hx of Anesthetic Complications    HEENT    + Corrective Eyewear PULMONARY  Pertinent(-): smoking, asthma, COPD    CARDIOVASCULAR  Pertinent(-):  hypertension    GI/HEPATIC/RENAL  Last PO Intake: Enter Last PO Intake in ROS/Med Hx Tab  Pertinent(-):  GERD     ENDO/OTHER  Pertinent(-):  diabetes mellitus           Physical Exam    Airway            Mouth opening: normal            Mallampati: II            TM distance (fb): >3 FB            Neck ROM: full            Airway Impression: easy  Dental   Normal Exam   Cardiovascular  Normal Exam           Rhythm: regular           Rate: normal       Pulmonary   Normal Exam    breath sounds clear to auscultation    Mental Status   Normal Exam    oriented to person, place and time     ________________________________________________________________________  Plan  ASA Score  1  Anesthetic Plan general    Induction (routine IV); General Anesthesia/Sedation Maintenance Plan (inhaled agents); Airway (LMA); Line ( use current access); Monitoring (standard ASA); Positioning (supine); PONV Plan (dexamethasone and ondansetron); Pain (per surgical team and nerve block); PostOp (PACU)    Informed Consent     Risks:          Risks discussed were commensurate with the plan listed above with the following specific points: N/V, sore throat, aspiration, hypotension, failed block and infection , damage to:(eyes, nerves, teeth, blood vessels), allergic Rx, unexpected serious injury, awareness    Anesthetic Consent:      Anesthetic plan (and risks as noted above) were discussed with patient     Plan also discussed with team members including:  surgeon and CRNA    Attending Attestation:  As the primary attending anesthesiologist, I attest that the patient or proxy understands and accepts the risks and benefits of the anesthesia plan. I also attest that I have personally performed a pre-anesthetic examination and evaluation, and prescribed the anesthetic plan for this particular location within 48 hours prior to the anesthetic as documented.

## 2014-06-01 ENCOUNTER — Encounter: Payer: Self-pay | Admitting: Anesthesiology

## 2014-06-01 ENCOUNTER — Encounter
Disposition: A | Discharge status: Home / Self Care | Payer: Self-pay | Source: Ambulatory Visit | Attending: Sports Medicine

## 2014-06-01 ENCOUNTER — Ambulatory Visit
Admit: 2014-06-01 | Discharge: 2014-06-01 | Disposition: A | Discharge status: Home / Self Care | Payer: Self-pay | Source: Ambulatory Visit | Attending: Sports Medicine | Admitting: Sports Medicine

## 2014-06-01 HISTORY — PX: PR KNEE SCOPE,REMV LOOSE BODY: 29874

## 2014-06-01 HISTORY — PX: PR ARTHROSCOPY KNEE SURG REMOVAL LOOSE/FOREIGN BODY: 29874

## 2014-06-01 SURGERY — ARTHROSCOPY, KNEE, WITH LOOSE BODY REMOVAL
Anesthesia: General | Site: Knee | Laterality: Left | Wound class: Clean

## 2014-06-01 MED ORDER — BUPIVACAINE-EPINEPHRINE 0.5 % IJ SOLUTION *WRAPPED*
INTRAMUSCULAR | Status: DC | PRN
Start: 2014-06-01 — End: 2014-06-01
  Administered 2014-06-01: 30 mL via PERINEURAL

## 2014-06-01 MED ORDER — LIDOCAINE HCL 1 % IJ SOLN *I*
INTRAMUSCULAR | Status: AC
Start: 2014-06-01 — End: 2014-06-01
  Filled 2014-06-01: qty 2

## 2014-06-01 MED ORDER — BUPIVACAINE-EPINEPHRINE 0.25 % IJ SOLUTION *WRAPPED*
INTRAMUSCULAR | Status: DC | PRN
Start: 2014-06-01 — End: 2014-06-01
  Administered 2014-06-01: 30 mL via INTRA_ARTICULAR

## 2014-06-01 MED ORDER — LACTATED RINGERS IV SOLN *I*
20.0000 mL/h | INTRAVENOUS | Status: DC
Start: 2014-06-01 — End: 2014-06-02
  Administered 2014-06-01: 20 mL/h via INTRAVENOUS

## 2014-06-01 MED ORDER — ONDANSETRON HCL 2 MG/ML IV SOLN *I*
INTRAMUSCULAR | Status: DC | PRN
Start: 2014-06-01 — End: 2014-06-01
  Administered 2014-06-01: 4 mg via INTRAVENOUS

## 2014-06-01 MED ORDER — DEXAMETHASONE SODIUM PHOSPHATE 4 MG/ML INJ SOLN *WRAPPED*
INTRAMUSCULAR | Status: DC | PRN
Start: 2014-06-01 — End: 2014-06-01
  Administered 2014-06-01: 4 mg via INTRAVENOUS

## 2014-06-01 MED ORDER — MIDAZOLAM HCL 1 MG/ML IJ SOLN *I* WRAPPED
INTRAMUSCULAR | Status: DC | PRN
Start: 2014-06-01 — End: 2014-06-01
  Administered 2014-06-01: 2 mg via INTRAVENOUS

## 2014-06-01 MED ORDER — HYDROMORPHONE HCL PF 1 MG/ML IJ SOLN *WRAPPED*
INTRAMUSCULAR | Status: AC
Start: 2014-06-01 — End: 2014-06-01
  Filled 2014-06-01: qty 1

## 2014-06-01 MED ORDER — CEFAZOLIN SODIUM 1000 MG IJ SOLR *I*
2000.0000 mg | INTRAMUSCULAR | Status: AC
Start: 2014-06-01 — End: 2014-06-01
  Administered 2014-06-01: 2 g via INTRAVENOUS
  Administered 2014-06-01: 1 g via INTRAVENOUS

## 2014-06-01 MED ORDER — LIDOCAINE HCL 1 % IJ SOLN *I*
0.1000 mL | Freq: Once | INTRAMUSCULAR | Status: DC | PRN
Start: 2014-06-01 — End: 2014-06-02
  Administered 2014-06-01: 0.1 mL via SUBCUTANEOUS

## 2014-06-01 MED ORDER — ACETAMINOPHEN 10 MG/ML IV SOLN *I*
INTRAVENOUS | Status: DC | PRN
Start: 2014-06-01 — End: 2014-06-01
  Administered 2014-06-01: 1000 mg via INTRAVENOUS

## 2014-06-01 MED ORDER — HYDROMORPHONE HCL PF 1 MG/ML IJ SOLN *WRAPPED*
INTRAMUSCULAR | Status: DC | PRN
Start: 2014-06-01 — End: 2014-06-01
  Administered 2014-06-01: 1 mg via INTRAVENOUS

## 2014-06-01 MED ORDER — KETOROLAC TROMETHAMINE 30 MG/ML IJ SOLN *I*
INTRAMUSCULAR | Status: DC | PRN
Start: 2014-06-01 — End: 2014-06-01
  Administered 2014-06-01: 30 mg via INTRAVENOUS

## 2014-06-01 MED ORDER — LIDOCAINE HCL 2 % IJ SOLN *I*
INTRAMUSCULAR | Status: DC | PRN
Start: 2014-06-01 — End: 2014-06-01
  Administered 2014-06-01: 60 mg via INTRAVENOUS

## 2014-06-01 MED ORDER — CEFAZOLIN SODIUM 1000 MG IJ SOLR *I*
INTRAMUSCULAR | Status: AC
Start: 2014-06-01 — End: 2014-06-01
  Filled 2014-06-01: qty 20

## 2014-06-01 MED ORDER — FENTANYL CITRATE 50 MCG/ML IJ SOLN *WRAPPED*
INTRAMUSCULAR | Status: DC | PRN
Start: 2014-06-01 — End: 2014-06-01
  Administered 2014-06-01: 11:00:00 100 ug via INTRAVENOUS

## 2014-06-01 MED ORDER — PROPOFOL 10 MG/ML IV EMUL (INTERMITTENT DOSING) WRAPPED *I*
INTRAVENOUS | Status: DC | PRN
Start: 2014-06-01 — End: 2014-06-01
  Administered 2014-06-01: 50 mg via INTRAVENOUS
  Administered 2014-06-01: 200 mg via INTRAVENOUS
  Administered 2014-06-01 (×2): 50 mg via INTRAVENOUS

## 2014-06-01 SURGICAL SUPPLY — 36 items
BANDAGE ELAST SLF-CLSR 4 X5.5 LF NONSTER (Dressing) ×2 IMPLANT
BLADE FULL RADIUS RESECTOR 3.5MM (Supply) ×2 IMPLANT
BLADE SURG CARBON STEEL #10 STER (Supply) ×8 IMPLANT
BLADE SURG CARBON STEEL #15 STER (Supply) ×4 IMPLANT
BLADE SURG CARBON STEEL #15 STER REUSE HNDL (Supply) ×2 IMPLANT
BOOT KNEE HIGH NON SKID (Supply) ×2 IMPLANT
BRACE BREG POST-OP KNEE SHT (Supply) IMPLANT
BUR CARBIDE MED RND 5MM LF (Other) IMPLANT
BUR CARBIDE RND 3MM LF (Other) IMPLANT
CLOSURE STERI-STRIP REINF .5 X 4IN LF (Dressing) ×2 IMPLANT
COUNTER NEEDLE ADH 20/40FM BLACK STER (Supply) IMPLANT
COVER C ARM W104XL213CM PNL 76X61CM FTSWCH W36XL76CM MINI FOR OEC 6800 (Drape) ×1 IMPLANT
COVER CAMERA LIGHT HANDLE DISP (Supply) IMPLANT
DRAPE C-ARM 42 IN X 120 IN S1 (Drape) IMPLANT
DRAPE MINI C-ARM (Drape) ×1
DRAPE SHEET 70X100 (Drape) ×1
DRAPE SUR W70XL100IN STD SMS POLYPR FULL SHT W/O FLD PCH DISP (Drape) ×1 IMPLANT
DRAPE T-ARTHRSC W/POUCH 90X122X114 (Drape) ×2 IMPLANT
DRESSING XEROFORM 5 IN X9 IN (Dressing) ×2 IMPLANT
GLOVE SURG PROTEXIS PI CLASSIC 8.5 PF SYN (Glove) ×14 IMPLANT
GLOVE SURG PROTEXIS PI CLASSIC 9.0 PF SYN (Glove) ×2 IMPLANT
GOWN SIRIUS RAGLAN NONREINFORCED XL  USE 188665 (Gown) ×4 IMPLANT
KIT SKIN SCRUB (Pack) ×1
MAT SUCT FLOOR PUDDLE GUPPY (Supply) IMPLANT
PACK CUSTOM ACL CDS (Pack) ×2 IMPLANT
PADDING WEBRIL 4IN LF NONSTER (Dressing) ×2 IMPLANT
SET ARTHRSC CASSETTE TUBE (Supply) ×2 IMPLANT
SOL LACT RINGER IRRIG 3000ML BAG (Drug) ×4 IMPLANT
SOL SODIUM CHLORIDE IRRIG 1000ML BTL (Solution) ×2 IMPLANT
SUTR ETHILON 4-0 PS-2 BLACK (Suture) ×2 IMPLANT
SUTR ETHILON MONO 3-0 PS-2 BLACK (Suture) ×2 IMPLANT
SUTR VICRYL ANTIB 1 CT-1 POP 18 VIOLET (Suture) ×4 IMPLANT
SUTR VICRYL ANTIB BRD 2-0 CP-2 18 UNDYED (Suture) IMPLANT
TRAY SCRUB DRY SKIN INCLUDES 6 WINGED SPONGES 6 SPONGE STICKS 2 COTTON TIP APPLICATORS ABSORBENT/BLOTTING TOWELS PREMIUM (Pack) ×1 IMPLANT
TUBING NONCONDUC CONN 12FT X 3/16IN (Tubing) ×2 IMPLANT
WAND ELIM WITH CABLE 4.5 X 90DEG (Supply) IMPLANT

## 2014-06-01 NOTE — Progress Notes (Signed)
Pt ready for discharge and Dr Reynolds BowlLu stated he could leave and he would write a discharge note.

## 2014-06-01 NOTE — Op Note (Deleted)
Jose Resides:   Keller, Jose Keller  MR #:  [id number]   ACCOUNT #:  [id number] DOB:  September 06, 1990    AGE:  23     SURGEON:  Kristine LineaLucien M Maruice Pieroni, MD  CO-SURGEON:    ASSISTANT:    SURGERY DATE:  06/01/2014    PREOPERATIVE DIAGNOSIS:    1. Posttraumatic arthritis of the patellofemoral joint, left knee, status post patellar fracture dislocation with open reduction and internal fixation of osteochondral fracture of patella.  2. Retained hardware, left knee, of patella.    OPERATIVE PROCEDURE:    1. Surgical arthroscopy with debridement and chondroplasty (CPT [id number]).  2. Open removal of screws from left patella, requiring separate incision (CPT 20680).    ANESTHESIA:  General plus single-shot femoral nerve block.    COMPLICATIONS:  None.    OPERATIVE INDICATIONS:  This is a 23 year old male who is 9 weeks status post open reduction and internal fixation of osteochondral fractures with associated loose bodies of the patellar fracture dislocation of his left knee.  He has done well postoperatively.  He is brought back for arthroscopic evaluation of the patellofemoral joint, looking at the tracking, debridement as needed and then open removal of the patellar screws through a separate incision.   Preoperatively, risks, benefits, treatment options and limitations reviewed.  Questions invited and answered.    DESCRIPTION OF PROCEDURE:  After appropriate consent was obtained, the patient was taken to the operating room where excellent general anesthesia was administered.  The patient remained stable throughout the case.  The patient also had a single-shot femoral nerve block placed for postop pain control.  The patient remained stable throughout the case.     The patient's left knee was examined under anesthesia, and it was found to have no ligamentous instability.     The patient's left lower extremity was then prepped and draped free in the usual sterile manner.  A superomedial outflow portal was created, then anterolateral and anteromedial  portals created.  Arthroscopic findings were as follows:  Suprapatellar pouch, medial and lateral gutters, were without abnormalities.  The patellofemoral joint showed very good tracking.  It was well centered at full extension and stayed centered throughout a full arc.  Medial and lateral compartments showed intact articular surfaces, intact menisci.  Intercondylar notch showed intact ACL and PCL.     There was shallow grooving of the lateral edge of the trochlea, grade 2, that underwent limited debridement.  There were several edges on the edge of the osteochondral ORIF that had some fibrillations and underwent limited debridement.  However, the bulk of the loose bodies appeared well healed.  Again, he had had multiple loose bodies as part of this with 2 main fragments and underwent fixation with a total of 5 screws.     After the arthroscopic part of the procedure, an incision was made using the prior incision along the medial border of his patella.  The extensor mechanism was identified and the incision made from the distal medial quad tendon, around the edge of the patella, along the medial edge of the patellar tendon into the fat pad to allow for the patella to be everted for exposure of the undersurface.  Three screws were readily evident, and 2 more distal screws were covered over with arthrofibrotic scar tissue.  These were localized with the C arm and then removed.  The other 3 screws were removed.  With removal of the screws, defects were still very well healed, stable and, again, had excellent  healing.  A final irrigation was performed.  The quad tendon and medial retinaculum were then repaired using interrupted #1 Vicryl.  After closure of the extensor mechanism, the scope was reintroduced to make sure the tracking was still good and this was central throughout a full arc of motion with normal excursion.  The subcu was then closed with 2-0 Vicryl and the skin closed with 3-0 Monocryl.  Sterile dressing  applied, and the patient placed in a hinged knee brace locked in full extension.  The patient tolerated the procedure well and was taken to recovery room in stable condition.             ______________________________  Kristine LineaLucien M Zoha Spranger, MD    LMR/MODL  DD:  06/01/2014 13:34:57  DT:  06/01/2014 14:44:46  Job #:  [id number]    cc:

## 2014-06-01 NOTE — Discharge Instructions (Signed)
Knee Surgery  Dr. Norina BuzzardLucien Rouse  Phone - [phone].    Weight bearing as tolerated brace locked in extension. You may unlock brace for transportation in a car.  Please keep dressing clean, dry and intact for 5 day.         IF ANY OF THE FOLLOWING PERSIST, NOTIFY YOUR DOCTOR:    1. Pain that increases in intensity.  2. Increasing swelling of the knee.  3. Elevated temperature not associated with any other illness.      Due to the sedation medication and or general anesthesia you have been given today, please follow these discharge instructions:    [x]  Do not drive or operate any machinery for 24 hours or the specified time frame that was recommended by your doctor, please refer to post op instructions.  [x]  Do not drink any alcoholic beverages for 24 hours after your procedure and/or if you are taking a narcotic pain reliever (e.g. Vicodin, Percocet or Tylenol #3).  [x]  Do not make any major decisions or sign contracts for 24 hours.  [x]  Prescription information given to patient and/or patient representative.    Diet: begin with liquids, advance as tolerated.    Your last pain medication was given to you at:  Nerve Block.  Toradol at 1215/acetaminophen at 1220    Your next dose of pain medication is due after: Begin Toradol after 615pm  Hydrocodone after 620pm    If unable to reach your doctor at [phone], call 911 for a true emergency or go to your nearest emergency department.    It is our recommendation that you have someone stay with you for 24 hours following your procedure.

## 2014-06-01 NOTE — Op Note (Signed)
PATIENT:   Jose Keller, Jose Keller  MR #:  [id number]   ACCOUNT #:  [id number] DOB:  03/16/1991    AGE:  23     SURGEON:  Tiaja Hagan M Maysel Mccolm, MD  CO-SURGEON:    ASSISTANT:    SURGERY DATE:  06/01/2014    PREOPERATIVE DIAGNOSIS:    1. Posttraumatic arthritis of the patellofemoral joint, left knee, status post patellar fracture dislocation with open reduction and internal fixation of osteochondral fracture of patella.  2. Retained hardware, left knee, of patella.    OPERATIVE PROCEDURE:    1. Surgical arthroscopy with debridement and chondroplasty (CPT 29877).  2. Open removal of screws from left patella, requiring separate incision (CPT 20680).    ANESTHESIA:  General plus single-shot femoral nerve block.    COMPLICATIONS:  None.    OPERATIVE INDICATIONS:  This is a 23-year-old male who is 9 weeks status post open reduction and internal fixation of osteochondral fractures with associated loose bodies of the patellar fracture dislocation of his left knee.  He has done well postoperatively.  He is brought back for arthroscopic evaluation of the patellofemoral joint, looking at the tracking, debridement as needed and then open removal of the patellar screws through a separate incision.   Preoperatively, risks, benefits, treatment options and limitations reviewed.  Questions invited and answered.    DESCRIPTION OF PROCEDURE:  After appropriate consent was obtained, the patient was taken to the operating room where excellent general anesthesia was administered.  The patient remained stable throughout the case.  The patient also had a single-shot femoral nerve block placed for postop pain control.  The patient remained stable throughout the case.     The patient's left knee was examined under anesthesia, and it was found to have no ligamentous instability.     The patient's left lower extremity was then prepped and draped free in the usual sterile manner.  A superomedial outflow portal was created, then anterolateral and anteromedial  portals created.  Arthroscopic findings were as follows:  Suprapatellar pouch, medial and lateral gutters, were without abnormalities.  The patellofemoral joint showed very good tracking.  It was well centered at full extension and stayed centered throughout a full arc.  Medial and lateral compartments showed intact articular surfaces, intact menisci.  Intercondylar notch showed intact ACL and PCL.     There was shallow grooving of the lateral edge of the trochlea, grade 2, that underwent limited debridement.  There were several edges on the edge of the osteochondral ORIF that had some fibrillations and underwent limited debridement.  However, the bulk of the loose bodies appeared well healed.  Again, he had had multiple loose bodies as part of this with 2 main fragments and underwent fixation with a total of 5 screws.     After the arthroscopic part of the procedure, an incision was made using the prior incision along the medial border of his patella.  The extensor mechanism was identified and the incision made from the distal medial quad tendon, around the edge of the patella, along the medial edge of the patellar tendon into the fat pad to allow for the patella to be everted for exposure of the undersurface.  Three screws were readily evident, and 2 more distal screws were covered over with arthrofibrotic scar tissue.  These were localized with the C arm and then removed.  The other 3 screws were removed.  With removal of the screws, defects were still very well healed, stable and, again, had excellent   healing.  A final irrigation was performed.  The quad tendon and medial retinaculum were then repaired using interrupted #1 Vicryl.  After closure of the extensor mechanism, the scope was reintroduced to make sure the tracking was still good and this was central throughout a full arc of motion with normal excursion.  The subcu was then closed with 2-0 Vicryl and the skin closed with 3-0 Monocryl.  Sterile dressing  applied, and the patient placed in a hinged knee brace locked in full extension.  The patient tolerated the procedure well and was taken to recovery room in stable condition.             ______________________________  Jose LineaLucien M Vaniah Chambers, MD    LMR/MODL  DD:  06/01/2014 13:34:57  DT:  06/01/2014 14:44:46  Job #:  [id number]    cc:

## 2014-06-01 NOTE — Progress Notes (Signed)
[facility]  Peri-Operative Care Note      Patient is seen immediate post-op left knee surgery - open screw removal - performed 06/01/2014.    Pain Assessment  Pain control measures reviewed, including appropriate use of ice and medications as prescribed. Patient is to call MD in the event of uncontrolled pain, nausea/vomiting or fever or with any additional concerns.    Joint Protection and Education  Proper don/doff of brace instructed pre-operatively, reviewed/confirmed post-operatively.   Joint protection concepts for ADLs, dressing, sleeping and self-care instructed pre-operatively; reviewed/confirmed post-operatively.     Immediate Post-Operative Exercises  Patient was instructed to perform exercises as follows per post-operative protocol:   Foot/ankle- Ankle pumps, begin today    Knee- Quad sets, begin today    Knee- PROM - flex, ext, begin tomorrow, as tolerated    Activity Counseling  Touch down Weight-Bearing - left lower extremity for 48 hours, then progress as tolerated.  Patient was educated on crutch ambulation including stairs.  Patient was advised to avoid stairs for the first 24-48 hours.  Patient was instructed not to drive.    Goals:  1. Pt independent with joint protection concepts for ADLs  2. Pt independent with joint ROM/muscular exercises as instructed and appropriate    Patient/family provided contact information for additional questions/concerns/follow-up care.  Patient was [place] therapy [place].  Patient will continue therapy there until he returns to school in [place] in January.  All questions were invited and answered to their satisfaction.    Ebbie Ridgearrie Phelps, ATC

## 2014-06-01 NOTE — Anesthesia Postprocedure Evaluation (Signed)
Anesthesia Post-Op Note    Patient: Zenia ResidesZachary Cotterman    Procedure(s) Performed:  Procedure Summary     Date Anesthesia Start Anesthesia Stop Room / Location    06/01/14 1052 1310 [id number]       Procedure Diagnosis Surgeon Attending Anesthesia    KNEE ARTHROSCOPY WITH OPEN SCREW REMOVAL (Left Knee) Retained orthopedic hardware Norina Buzzardouse, Lucien, MD Tawni MillersLu, Debbora Ang, MD     (Retained orthopedic hardware [Z96.9])          Anesthesia type:  General  Complications Noted (Any):  None   Comment:    Patient Location:  PACU  Level of Consciousness:    Recovered to baseline, awake, oriented and responds to stimulation  Patient Participation:     Able to participate  Temperature Status:    Normothermic  Oxygen Saturation:    Within patient's normal range  Cardiac Status:   Within patient's normal range and stable  Fluid Status:    Stable and euvolemic  Airway Patency:     Yes  Pulmonary Status:    Baseline and stable  Pain Management:    Adequate analgesia  Nausea and Vomiting:  None    Post Op Assessment:    Tolerated procedure well and no evidence of recall   Attending Attestation:  All indicated post anesthesia care provided

## 2014-06-01 NOTE — Anesthesia Case Conclusion (Signed)
CASE CONCLUSION  Emergence  Actions:  LMA removed  Criteria Used for Airway Removal:  Adequate Tv & RR, acceptable O2 saturation and following commands  Assessment:  Routine  Transport  Directly to: PACU  Airway:  Nasal cannula  Oxygen Delivery:  2 lpm  Position:  Recumbent  Patient Condition on Handoff  Level of Consciousness:  Mildly sedated  Patient Condition:  Stable  Handoff Report to:  RN

## 2014-06-01 NOTE — H&P (Signed)
UPDATES TO PATIENT'S CONDITION on the DAY OF SURGERY/PROCEDURE    I. Updates to Patient's Condition (to be completed by a provider privileged to complete a H&P, following reassessment of the patient by the provider):    (Inpatients only): I confirm that progress notes within the past 24 hours document updates to the patient's condition.            II. Procedure Readiness   I have reviewed the patient's H&P and updated condition. By completing and signing this form, I attest that this patient is ready for surgery/procedure.      III. Attestation   I have reviewed the updated information regarding the patient's condition and it is appropriate to proceed with the planned surgery/procedure.    Norina BuzzardLUCIEN Dura Mccormack, MD as of 10:41 AM 06/01/2014

## 2014-06-01 NOTE — Anesthesia Procedure Notes (Addendum)
----------------------------------------------------------------------------------------------------------------------------------------    Femoral Nerve Block    Date of Procedure: 06/01/2014 10:50 AM    Laterality:  Left    Injection Technique: Single-shot    Reason for Block: Post-op pain management        Patient Location: Pre-op  CONSENT AND TIMEOUT     Consent:  Obtained per policy    Timeout: patient identified (name/DOB) , nerve block procedure/site/side verified against block consent form , nerve block procedure/site/side verified by patient or family, proper patient position verified, operative procedure/site/side verified by patient or family , block site initialed by attending, needed equipment, monitors, medications and access verified as present and functioning , operative procedure/side/site verified against surgical consent, allergies reviewed with patient/record , operative procedure/side/site verified against surgical schedule, all members of the block team participated in the timeout and anticoagulation/antiplatelet status reviewed  METHOD     Patient Position:  Supine    Monitoring:  Continuous pulse ox with supplemental oxygen and Blood pressure    Sedation Used:  Yes    Level of Sedation: Minimal              for meds injected see MAR portion of chart    Prep:  Chloraprep    Approach:  In-plane    Technique: Ultrasound guided       Attempts:  1   NEEDLE     Type:  Short-bevel     Gauge: 22 G     Length: 5 cm  BLOCK EVENTS      No Paresthesia with needle     No Paresthesia with injection     No significant resistance to injection     No significant pain on injection     No blood aspirated     No intravascular injection     Well Tolerated  LOSS ASSESSED AS OF       Motor:  06/01/2014 10:51 AM  STAFF     Performed by: Extender under direct supervision    Attending Attestation: I was present for the entire procedure     Attending: Shalimar Mcclain  Extender: YIN,  SHICHEN  ----------------------------------------------------------------------------------------------------------------------------------------  ---------------------------------------------------------------------------------------------------------------------------------------  AIRWAY   GENERAL INFORMATION AND STAFF    Patient location during procedure: OR  CONDITION PRIOR TO MANIPULATION     Current Airway/Neck Condition:  Normal        For more airway physical exam details, see Anesthesia PreOp Evaluation  AIRWAY METHOD     Patient Position:  Sniffing    Preoxygenated: yes      Induction: IV  Mask Difficulty Assessment:  1 - vent by mask    Number of Attempts at Approach:  1    Number of Other Approaches Attempted:  0  FINAL AIRWAY DETAILS    Final Airway Type:  LMA    Final LMA: Unique    LMA Size: 4  ----------------------------------------------------------------------------------------------------------------------------------------

## 2014-06-01 NOTE — INTERIM OP NOTE (Signed)
Interim Op Note (Surgical Log ID: [id number])       Date of Surgery: 06/01/2014       Surgeons: Surgeon(s) and Role:     Norina Buzzard* Rouse, Lucien, MD - Primary     * Allyne GeeLander, Koral Thaden, MD - Resident - Assisting       Pre-op Diagnosis: Pre-Op Diagnosis Codes:     * Retained orthopedic hardware [Z96.9]       Post-op Diagnosis: Post-Op Diagnosis Codes:     * Retained orthopedic hardware [Z96.9]       Procedure(s) Performed: Procedure:    KNEE ARTHROSCOPY WITH OPEN SCREW REMOVAL  CPT(R) Code:  [id number] - PR KNEE SCOPE,REMV LOOSE BODY       Additional CPT Codes:        Anesthesia Type: General        Fluid Totals: I/O this shift:  12/23 0700 - 12/23 1459  In: 1600 (19.5 mL/kg) [I.V.:1600]  Out: 10 (0.1 mL/kg) [Blood:10]  Net: 1590  Weight: 82.2 kg        Estimated Blood Loss: Blood Loss: 10 mL       Specimens to Pathology:  * No specimens in log *       Temporary Implants:        Packing:                 Patient Condition: good       Findings (Including unexpected complications): none     Signed:  Allyne GeeSarah Glennie Bose, MD  on 06/01/2014 at 1:10 PM

## 2014-06-06 ENCOUNTER — Encounter: Payer: Self-pay | Admitting: Sports Medicine

## 2014-06-06 ENCOUNTER — Encounter: Payer: Self-pay | Admitting: Gastroenterology

## 2014-06-22 ENCOUNTER — Encounter: Payer: Self-pay | Admitting: Gastroenterology

## 2014-06-24 ENCOUNTER — Ambulatory Visit: Payer: Self-pay | Admitting: Physical Therapy

## 2014-06-24 DIAGNOSIS — M25562 Pain in left knee: Secondary | ICD-10-CM

## 2014-06-24 NOTE — Progress Notes (Signed)
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 male who is present today for left knee care.  Mechanism of injury/history of symptoms: pt. originally injured his left knee while rock climbing when his patella dislocated.  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  Pt. had a second surgery to remove the hardeware in his knee on 06/01/14.  Pt. has been going to a PT clinic in his hometown and he now returns to school.  He reports that his rehab has been progressing well and his pain level has been minimal lately.  His goal is to return to an active lifestyle without increased s/s.     Pt has received care for: 1st visit since most recent surgery  Attendance:  Good    Compliance:  Good   Symptom location: Anterior, left  Relevant symptoms: Aching, Decreased ROM, Decreased strength    Symptom frequency: Intermittent  Symptom intensity (0 - 10 scale): Now 1 Best 0 Worst 4    Patients goals for therapy: Reduce pain, Increase ROM, Increase strength, Independent with home program     Objective:    Observation: Edema, Effusion   Palpation:  WNL     ROM/Strength:    left knee          LE LEFT   RIGHT   Strength*    PROM AROM PROM AROM Left Right   Knee ext  7 hyp   4    flex  120   4                                                          *pt. Able to perform SLR 30x without extensor lag today*    Special Tests:   Hip: NA     Knee: NA     Ankle: NA     Functional:  Perform self-care activities/basic ADLs - able to perform.  Walk more than 30 minutes - unable to perform.  Ascend stairs with reciprocal gait - unable to perform.  Descend stairs with reciprocal gait - unable to perform.  Return to sport/activities - unable to perform.    Assessment  Findings consistent with 24 y.o., male with left knee arthroscopy with hardware removal  with pain, ROM limitations,  strength limitations, functional limitations    Prognosis:  Good    Contraindications/Precautions/Limitation:  WBAT  Short Term Goals: 1 week(s) Minimal assistance with HEP/ education concepts  Long Term Goals:3 month(s) Pain/Sx 0 - minimal, ROM/ flexibility WNL , Restoration of functional strength, Independent with HEP/education , Functional return to ADLs / activites without limitation        Treatment Plan:   Patient/family involved in developing goals and treatment plan: Yes  Freq 1-2 times per week(s) for 3 month(s)     Exercise: Stretching, Progressive Resistive, Aerobic exercise   Manual Techniques:  N/A   Modalities:  Cryotherapy    Functional: Proprioception/Dynamic stability, Functional rehab     Thank you for [facility] this [facility] to [facility].    Jose Keller, PT    Bike  10 min  Prone quad stretch 10sec x 10   Quad set  EMG 30x    SLR  EMG 30x    Standing calf raise 30x   Mini squat  30x   Mini lunge 10x    SLB 30sec x 4

## 2014-06-24 NOTE — Progress Notes (Signed)
SPORTS PT CHARGE:   Evaluation:  PT CODE 3330 - CPT CODE PT - 97002 - Re-Eval (30 minutes)  Supervised Modalities:  PT CODE 203 - CPT CODE PT - 97016 -  Vasopneumatic devices  Therapeutic Procedures:  PT CODE 314 - CPT CODE PT - 97110 - Therapeutic Exercise (ea 15 min) x 1 unit  Total Minute Time:  60 min  Status/POC: Status complete

## 2014-07-01 ENCOUNTER — Ambulatory Visit: Payer: Self-pay | Admitting: Physical Therapy

## 2014-07-01 DIAGNOSIS — M25562 Pain in left knee: Secondary | ICD-10-CM

## 2014-07-01 NOTE — Progress Notes (Signed)
SPORTS PT CHARGE:   Therapeutic Procedures:  PT CODE 314 - CPT CODE PT - 97110 - Therapeutic Exercise (ea 15 min) x 3 units  PT CODE 315 - CPT CODE PT - 97112 -  Neuromuscular Re-Educ (ea 15 minutes) x 1 unit  Total Minute Time: 60 min  Status/POC:

## 2014-07-01 NOTE — Progress Notes (Signed)
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 tolerated the last treatment session well; no significant complications after that visit.  He feels less pain with his ADL's at this time.      Objective:  ROM - Improved, Left, Knee, EXT 0, FLEX 120  Strength - Improved,  Per Ther Ex flowsheet  Function: - Improved  Education:  Updated HEP, Verbal cues for ther ex    Objective       Bike  10 min    Prone quad stretch 10sec x 10   Quad set  EMG 30x    SLR  EMG 30x    Standing calf raise 30x   Mini squat  30x   Mini lunge 10x    SLB 30sec x 4   Glider 3 way  1x10   Cybex LP  Single leg 3x10  50lbs   Cybex HS curl  Single leg 3x10  10lbs                       Treatment:  Ther Exercise per flowsheet    Assessment:   Pt. Demonstrates improvements with ROM and quad control today.  He was able to progress WB activities in the clinic without significant complications today.        Plan of Care:  Continue per Plan of care -  As written    [facility] this [facility]    Krystal EatonAdam Mazi Schuff, PT

## 2014-07-08 ENCOUNTER — Ambulatory Visit: Payer: Self-pay | Admitting: Physical Therapy

## 2014-07-08 DIAGNOSIS — M25562 Pain in left knee: Secondary | ICD-10-CM

## 2014-07-08 NOTE — Progress Notes (Signed)
SPORTS PT CHARGE:   Therapeutic Procedures:  PT CODE 314 - CPT CODE PT - 97110 - Therapeutic Exercise (ea 15 min) x 3 units  PT CODE 315 - CPT CODE PT - 97112 -  Neuromuscular Re-Educ (ea 15 minutes) x 1 unit  Total Minute Time: 60 min  Status/POC:

## 2014-07-08 NOTE — Progress Notes (Signed)
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 reports improvements with the pain level during his ADL's this week.  He has been having less pain walking to class and has been able to perform his HEP at the gym without significant complications.     Objective:  ROM - Improved, Left, Knee, EXT 0, FLEX 128  Strength - Improved,  Per Ther Ex flowsheet  Function: - Improved  Education:  Updated HEP, Verbal cues for ther ex    Objective       Bike  5 min    Elliptical  5 min    Prone quad stretch 10sec x 10   Quad set  EMG 30x    SLR  EMG 30x    Standing calf raise 30x   Mini squat  30x  12lbs   HS bendover  30x  12lbs    Bridge  L1  2x10   SLB  Rebounder 30x   2kg   Glider 3 way  1x10   Cybex LP  Single leg 3x10  60lbs   Cybex HS curl  Single leg 3x10  25lbs                       Treatment:  Ther Exercise per flowsheet    Assessment:   Progress noted with knee flex ROM at the start of the session today.  Able to progress PRE's without increased s/s in the left knee.  Muscular fatigue reported at end of session today.         Plan of Care:  Continue per Plan of care -  As written    [facility] this [facility]    Krystal EatonAdam Gautam Langhorst, PT

## 2014-07-15 ENCOUNTER — Ambulatory Visit: Payer: Self-pay | Admitting: Physical Therapy

## 2014-07-15 DIAGNOSIS — M25562 Pain in left knee: Secondary | ICD-10-CM

## 2014-07-15 NOTE — Progress Notes (Signed)
SPORTS PT CHARGE:   Therapeutic Procedures:  PT CODE 314 - CPT CODE PT - 97110 - Therapeutic Exercise (ea 15 min) x 3 units  PT CODE 315 - CPT CODE PT - 97112 -  Neuromuscular Re-Educ (ea 15 minutes) x 1 unit  Total Minute Time: 60 min  Status/POC:

## 2014-07-15 NOTE — Progress Notes (Signed)
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 reports no significant complications after the last treatment session.  He has noticed a small pocket of fluid beneath the surface of the skin along his incision site that started within the last 1-2 days.  Minimal discomfort around the area of swelling.      Objective:  ROM - Improved, Left, Knee, EXT 0, FLEX 135  Strength - Improved,  Per Ther Ex flowsheet  Function: - Improved  Education:  Updated HEP, Verbal cues for ther ex    Objective       Bike  N/a    Elliptical  10 min    Prone quad stretch 10sec x 10   Quad set  EMG 30x    SLR  EMG 30x    Standing calf raise 30x   Mini squat  30x  12lbs   HS bendover  30x  12lbs    Bridge  L1  2x10   SLB  Rebounder 30x   2kg   Glider 3 way  1x10   Cybex LP  Single leg 3x10  60lbs   Cybex HS curl  Single leg 3x10  25lbs   Step up 3x10  4 inch   Step down  3x10  4 inch               Treatment:  Ther Exercise per flowsheet    Assessment:   Pt. Demonstrates a small pocket of fluid that is dark red in nature and smaller than the size of a dime.  No drainage noted from this area and no signs of opening in the skin.  Pt. Instructed to monitor the blood blister for size and call the office with any significant changes.  Pt. Still able to make progress with ROM and strength in the clinic today.          Plan of Care:  Continue per Plan of care -  As written    [facility]    Krystal EatonAdam Bradd Merlos, PT

## 2014-07-22 ENCOUNTER — Ambulatory Visit: Payer: Self-pay | Admitting: Physical Therapy

## 2014-07-22 DIAGNOSIS — M25562 Pain in left knee: Secondary | ICD-10-CM

## 2014-07-22 NOTE — Progress Notes (Signed)
SPORTS PT CHARGE:   Therapeutic Procedures:  PT CODE 314 - CPT CODE PT - 97110 - Therapeutic Exercise (ea 15 min) x 3 units  PT CODE 315 - CPT CODE PT - 97112 -  Neuromuscular Re-Educ (ea 15 minutes) x 1 unit  Total Minute Time: 60 min  Status/POC:

## 2014-07-22 NOTE — Progress Notes (Signed)
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 reports that the blood blister he had at the last visit opened while in the shower and dried up after that.  Otherwise, no complications noted since the last PT visit.     Objective:  ROM - Improved, Left, Knee, EXT 0, FLEX 135  Strength - Improved,  Per Ther Ex flowsheet  Function: - Improved  Education:  Updated HEP, Verbal cues for ther ex    Objective       Bike  N/a    Elliptical  10 min    Prone quad stretch 10sec x 10   Quad set  EMG 30x    SLR  EMG 30x    Standing calf raise 30x  20lbs   Mini squat  30x  20lbs   HS bendover  30x  20lbs    Bridge   Green Ball  3x10   SLB  Rebounder 30x   2kg   Glider 3 way  1x10   Cybex LP  Single leg 3x10  60lbs   Cybex HS curl  Single leg 3x10  25lbs   Step up 3x10  4 inch   Step down  3x10  4 inch               Treatment:  Ther Exercise per flowsheet    Assessment:   The blood blister from the last visit resolved over the last week.  Pt. Able to progress PRE's without significant complications today.  Deficits noted with quad control during step down today.        Plan of Care:  Continue per Plan of care -  As written    [facility]    Krystal EatonAdam Hudson Lehmkuhl, PT

## 2014-07-29 ENCOUNTER — Ambulatory Visit: Payer: Self-pay | Admitting: Physical Therapy

## 2014-07-29 DIAGNOSIS — M25562 Pain in left knee: Secondary | ICD-10-CM

## 2014-07-29 NOTE — Progress Notes (Signed)
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 reports no significant complications since the last PT visit.  He feels that the swelling in his knee improves a little every day.  He feels better walking during his ADL's.    Objective:  ROM - Improved, Left, Knee, EXT 0, FLEX 135, right flex 140   Strength - Improved,  Per Ther Ex flowsheet  Function: - Improved  Education:  Updated HEP, Verbal cues for ther ex    Objective       TM walk 5% grade  3.4 mph   Elliptical  5 min    Prone quad stretch 10sec x 10   Quad set  EMG 30x    SLR  EMG 30x   2.5lbs   Standing calf raise 30x  30lbs   Mini squat  30x  30lbs   HS bendover  30x  30lbs    Bridge   Green Ball  3x10   SLB  Rebounder 30x   2kg   Glider 3 way  1x10   Cybex LP  Single leg 3x10  60lbs   Cybex HS curl  Single leg 3x10  25lbs   Step up 3x10  6 inch - 20lbs   Step down  3x10  4 inch - 20lbs               Treatment:  Ther Exercise per flowsheet    Assessment:   Pt. Continues to make progress with neuromuscular control in the left LE.  Improvements noted with swelling in the left knee today.         Plan of Care:  Continue per Plan of care -  As written    [facility]    Krystal EatonAdam Livianna Petraglia, PT

## 2014-07-29 NOTE — Progress Notes (Signed)
SPORTS PT CHARGE:   Therapeutic Procedures:  PT CODE 314 - CPT CODE PT - 97110 - Therapeutic Exercise (ea 15 min) x 3 units  PT CODE 315 - CPT CODE PT - 97112 -  Neuromuscular Re-Educ (ea 15 minutes) x 1 unit  Total Minute Time: 60 min  Status/POC:

## 2014-08-05 ENCOUNTER — Ambulatory Visit: Payer: Self-pay | Admitting: Physical Therapy

## 2014-08-05 DIAGNOSIS — M25562 Pain in left knee: Secondary | ICD-10-CM

## 2014-08-05 NOTE — Progress Notes (Signed)
SPORTS PT CHARGE:   Evaluation:  PT CODE 3330 - CPT CODE PT - 97002 - Re-Eval (30 minutes)  Therapeutic Procedures:  PT CODE 314 - CPT CODE PT - 97110 - Therapeutic Exercise (ea 15 min) x 2 units  Total Minute Time:  60 min  Status/POC: Status complete

## 2014-08-05 NOTE — Progress Notes (Signed)
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 male who is present today for left knee care.  Mechanism of injury/history of symptoms: pt. was originally involved with an accident while rock climbing and he dislocated his patella.  He required surgery for fixation of an OCD fracture and required a second surgery for hardware removal.  Over the last month, pt. reports improvements with pain level and function.  He does have some increased s/s when sitting for longer periods of time and if he is more active t/o the day.  He returns to PT today for re-eval.     Pt has received care for 6 visits to date since most recent surgery  Attendance:  Good    Compliance:  Good   Symptom location: Anterior, left  Relevant symptoms: Pain , Decreased ROM, Decreased strength    Symptom frequency: Intermittent  Symptom intensity (0 - 10 scale): Now 0 Best 0 Worst 4    Patients goals for therapy: Reduce pain, Increase ROM, Increase strength, Independent with home program     Objective:    Observation: Atrophy   Palpation:  WNL     ROM/Strength:    left knee          LE LEFT   RIGHT   Strength    PROM AROM PROM AROM Left Right   Knee ext  10 hyp   4 5   flex  130   4 5                                                             Special Tests:   Hip: NA     Knee: NA     Ankle: NA     Functional:  Perform self-care activities/basic ADLs - able to perform.  Walk more than 30 minutes - able to perform.  Ascend stairs with reciprocal gait - able to perform but with increased pain.  Descend stairs with reciprocal gait - able to perform but with increased pain.  Return to sport/activities - unable to perform.    Assessment  Findings consistent with 24 y.o., male with left knee OCD fracture ORIF 4 months post op  with pain, swelling, ROM limitations, strength limitations, functional limitations.  Pt. Has been making steady  progress with ROM, strength and function in the clinic.  He does continue to have some swelling in the left knee, but this has been improving at a steady rate.  Pt. Also still has some deficits with neuromuscular control in the left LE during functional activities in the clinic.     Prognosis:  Good    Contraindications/Precautions/Limitation:  Per diagnosis  Short Term Goals: 1 week(s) Minimal assistance with HEP/ education concepts  Long Term Goals:2 month(s) Pain/Sx 0 - minimal, ROM/ flexibility WNL , Restoration of functional strength, Independent with HEP/education , Functional return to ADLs / activites without limitation        Treatment Plan:   Patient/family involved in developing goals and treatment plan: Yes  Freq 1 times per week(s) for 2 month(s)     Exercise: Stretching, Progressive Resistive, Aerobic exercise  Manual Techniques:  N/A   Modalities:  Cryotherapy    Functional: Proprioception/Dynamic stability, Functional rehab     Thank you for referring this patient to [facility].    Krystal Eaton, PT      TM walk 5% grade  3.9 mph  10 min    Elliptical  n/a   Prone quad stretch 10sec x 10   Quad set  EMG HEP   SLR  EMG 30x   2.5lbs   Slide board  L1 30sec x 5   Standing calf raise 30x  45lbs   Mini squat  30x  45lbs   HS bendover   Single leg 30x  20lb kb    Bridge   Green Ball  3x10   SLB  Rebounder 30x   2kg   Glider 3 way  1x10   Cybex LP  Single leg 3x10  60lbs   Cybex HS curl  Single leg 3x10  25lbs   Step up 3x10  6 inch - 20lbs   Step down  3x10  4 inch - 20lbs       Agility  Lateral shuffle, carioca, back pedal  90ft x 3 ea

## 2014-08-12 ENCOUNTER — Ambulatory Visit: Payer: Self-pay | Admitting: Physical Therapy

## 2014-08-12 DIAGNOSIS — M25562 Pain in left knee: Secondary | ICD-10-CM

## 2014-08-12 NOTE — Progress Notes (Signed)
SPORTS PT CHARGE:   Therapeutic Procedures:  PT CODE 314 - CPT CODE PT - 97110 - Therapeutic Exercise (ea 15 min) x 3 units  PT CODE 315 - CPT CODE PT - 97112 -  Neuromuscular Re-Educ (ea 15 minutes) x 1 unit  Total Minute Time: 60 min  Status/POC:

## 2014-08-12 NOTE — Progress Notes (Signed)
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  Pt. has noticed some increased clicking in the knee with walking t/o the day, but reports no pain with the clicking.     Objective:  ROM - Improved, Left, Knee, EXT 0, FLEX 135, right flex 140   Strength - Improved,  Per Ther Ex flowsheet  Function: - Improved  Education:  Updated HEP, Verbal cues for ther ex    Objective       TM walk 5% grade  3.4 mph - 10 min    Elliptical  HEP   Prone quad stretch 10sec x 10   Quad set  EMG 30x    SLR  EMG 30x   2.5lbs   Standing calf raise 30x  30lbs   Mini squat  30x  30lbs   HS bendover  30x  30lbs    Bridge   Green Ball  3x10   SLB  Rebounder 30x   2kg   Glider 3 way  1x10   Cybex LP  Single leg 3x10  60lbs   Cybex HS curl  Single leg 3x10  25lbs   Step up 3x10  6 inch - 20lbs   Step down  3x10  4 inch - 20lbs               Treatment:  Ther Exercise per flowsheet    Assessment:   Pt. Does continue to demonstrate improvements with edema surrounding the left knee.  Able to perform agility movements with no significant complications today.        Plan of Care:  Continue per Plan of care -  As written    [facility]    Krystal EatonAdam Benen Weida, PT

## 2014-08-17 ENCOUNTER — Ambulatory Visit: Payer: Self-pay | Admitting: Physical Therapy

## 2014-08-17 DIAGNOSIS — M25562 Pain in left knee: Secondary | ICD-10-CM

## 2014-08-17 NOTE — Progress Notes (Signed)
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  Pt. has noticed some increased clicking in the knee with walking t/o the day, but reports no pain with the clicking.     Objective:  ROM - Improved, Left, Knee, EXT 0, FLEX 135, right flex 140   Strength - Improved,  Per Ther Ex flowsheet  Function: - Improved  Education:  Updated HEP, Verbal cues for ther ex    Objective       TM walk 5% grade  3.4 mph - 10 min    Elliptical  HEP   Prone quad stretch 10sec x 10   Quad set  EMG 30x    SLR  EMG 30x   2.5lbs   Standing calf raise 30x  65lbs   Mini squat  30x  65lbs   HS bendover  30x  30lbs    Bridge   Green Ball  3x10   SLB  Rebounder 30x   4kg   Glider 3 way  1x10   Cybex LP  Single leg 3x10  60lbs   Cybex HS curl  Single leg 3x10  25lbs   Step up 3x10  6 inch - 20lbs   Step down  3x10  4 inch - 20lbs   Double leg hop   Lateral, forward  2x10 ea            Treatment:  Ther Exercise per flowsheet    Assessment:   Pt. Tried to initiate return to jog progression today but had discomfort around the patella with the impact.  Run progression will be held at this time and will re-test in the future.  Pt. Tolerated all other movements without significant complications today.        Plan of Care:  Continue per Plan of care -  As written    [facility]    Krystal EatonAdam Hiroshi Krummel, PT

## 2014-08-18 NOTE — Progress Notes (Signed)
SPORTS PT CHARGE:   Therapeutic Procedures:  PT CODE 314 - CPT CODE PT - 97110 - Therapeutic Exercise (ea 15 min) x 3 units  PT CODE 304 - CPT CODE PT - 97530 - Therapeutic Activites functional (ea. 15 minutes) x 1 unit  Total Minute Time: 60 min  Status/POC:

## 2014-08-26 ENCOUNTER — Ambulatory Visit: Payer: Self-pay | Admitting: Physical Therapy

## 2014-08-26 DIAGNOSIS — M25562 Pain in left knee: Secondary | ICD-10-CM

## 2014-08-26 NOTE — Progress Notes (Signed)
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 reports significant improvements with knee pain while performing the jumping activities during his HEP over the last week.  No c/o pain at the start of the session today.     Objective:  ROM - Improved, Left, Knee, EXT 0, FLEX 135, right flex 140   Strength - Improved,  Per Ther Ex flowsheet  Function: - Improved  Education:  Updated HEP, Verbal cues for ther ex    Objective       TM walk/jog 10 min  2 min walk/1 min jog    Elliptical  HEP   Prone quad stretch 10sec x 10   Quad set  EMG 30x    SLR  EMG 30x   2.5lbs   Standing calf raise 30x  65lbs   Mini squat  30x  65lbs   HS bendover  30x  30lbs    Bridge   Green Ball  3x10   SLB  Rebounder 30x   4kg   Glider 3 way  1x10   Cybex LP  Single leg 3x10  60lbs   Cybex HS curl  Single leg 3x10  25lbs   Step up 3x10  6 inch - 20lbs   Step down  3x10  4 inch - 20lbs   Double leg hop   Lateral, forward  2x10 ea            Treatment:  Ther Exercise per flowsheet    Assessment:   Pt. Tolerated DL jumping with no pain today.  He passed criteria for return to run testing today and initiated walk/jog progression without complications.         Plan of Care:  Continue per Plan of care -  As written    [facility]    Krystal EatonAdam Mckenzee Beem, PT

## 2014-08-26 NOTE — Progress Notes (Signed)
SPORTS PT CHARGE:   Therapeutic Procedures:  PT CODE 315 - CPT CODE PT - 97112 -  Neuromuscular Re-Educ (ea 15 minutes) x 1 unit  PT CODE 314 - CPT CODE PT - 97110 - Therapeutic Exercise (ea 15 min) x 2 units  PT CODE 304 - CPT CODE PT - 97530 - Therapeutic Activites functional (ea. 15 minutes) x 1 unit  Total Minute Time: 60 min  Status/POC:

## 2014-09-09 ENCOUNTER — Ambulatory Visit: Payer: Self-pay | Admitting: Physical Therapy

## 2014-09-09 DIAGNOSIS — M25562 Pain in left knee: Secondary | ICD-10-CM

## 2014-09-09 NOTE — Progress Notes (Signed)
SPORTS PT CHARGE:   Therapeutic Procedures:  PT CODE 314 - CPT CODE PT - 97110 - Therapeutic Exercise (ea 15 min) x 3 units  PT CODE 304 - CPT CODE PT - 97530 - Therapeutic Activites functional (ea. 15 minutes) x 1 unit  Total Minute Time: 60 min  Status/POC:

## 2014-09-09 NOTE — Progress Notes (Signed)
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 reports that he has been doing his jogging progression as instructed.  He feels improvements with the left knee discomfort after starting his jogging progression.  No c/o pain today.     Objective:  ROM - same as last visit, Left, Knee, EXT 0, FLEX 135, right flex 140   Strength - Improved,  Per Ther Ex flowsheet  Function: - Improved  Education:  Updated HEP, Verbal cues for ther ex    Objective       TM walk/jog 10 min  2 min walk/1 min jog    Elliptical  HEP   Prone quad stretch 10sec x 10   Quad set  EMG 30x    SLR  EMG 30x   2.5lbs   Standing calf raise 30x  65lbs   Mini squat  30x  65lbs   HS bendover  30x  30lbs    Bridge   Green Ball  3x10   SLB  Rebounder 30x   4kg   Glider 3 way  1x10   Cybex LP  Single leg 3x10  60lbs   Cybex HS curl  Single leg 3x10  25lbs   Step up 3x10  6 inch - 20lbs   Step down  3x10  4 inch - 20lbs   Double leg hop   Lateral, forward  2x10 ea            Treatment:  Ther Exercise per flowsheet    Assessment:   Pt. Demonstrates quad fatigue during his exercise program today, no significant complications noted with treatment session.        Plan of Care:  Continue per Plan of care -  As written    [facility] you for [facility] this [facility] to [facility]    Krystal EatonAdam Ledford Goodson, PT

## 2014-09-23 ENCOUNTER — Ambulatory Visit: Payer: Self-pay | Admitting: Physical Therapy

## 2014-09-23 DIAGNOSIS — M25562 Pain in left knee: Secondary | ICD-10-CM

## 2014-09-23 NOTE — Progress Notes (Signed)
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 male who is present today for left knee care.  Mechanism of injury/history of symptoms: pt. was originally involved with an accident while rock climbing and he dislocated his patella.  He required surgery for fixation of an OCD fracture and required a second surgery for hardware removal.  Over the last month, pt. Has been able to progress his functional activities outside of the clinic and has noticed improvements with pain and stability t/o the left LE.  His goal is to return to an active lifestyle without increased s/s.  Pt. Returns to PT today for re-eval.     Pt has received care for 10 visits to date since most recent surgery  Attendance:  Good    Compliance:  Good   Symptom location: Anterior, left  Relevant symptoms: Pain , Decreased ROM, Decreased strength    Symptom frequency: Intermittent  Symptom intensity (0 - 10 scale): Now 0 Best 0 Worst 4    Patients goals for therapy: Reduce pain, Increase ROM, Increase strength, Independent with home program     Objective:    Observation: Atrophy   Palpation:  WNL     ROM/Strength:    left knee          LE LEFT   RIGHT   Strength    PROM AROM PROM AROM Left Right   Knee ext  10 hyp   4 5   flex  130   4 5                                                             Special Tests:   Hip: NA     Knee: NA     Ankle: NA     Functional:  Perform self-care activities/basic ADLs - able to perform.  Walk more than 30 minutes - able to perform.  Ascend stairs with reciprocal gait - able to perform but with increased pain.  Descend stairs with reciprocal gait - able to perform but with increased pain.  Return to sport/activities - unable to perform.    Assessment  Findings consistent with 24 y.o., male with left knee OCD fracture ORIF 5 months post op  with pain, swelling, ROM limitations, strength  limitations, functional limitations.  Pt. Demonstrates progress with quad control during functional activities in the clinic.  He does still have difficulty with stability during plyometric type movements in the clinic.  He reports some feelings of giving way when he is fatigued.  He would benefit from continued PT to address stability in the left LE.      Prognosis:  Good    Contraindications/Precautions/Limitation:  Per diagnosis  Short Term Goals: 1 week(s) Minimal assistance with HEP/ education concepts  Long Term Goals:1 month(s) Pain/Sx 0 - minimal, ROM/ flexibility WNL , Restoration of functional strength, Independent with HEP/education , Functional return to ADLs / activites without limitation        Treatment Plan:   Patient/family involved in developing goals and treatment plan: Yes  Freq 1 times per week(s) for 1 month(s)     Exercise: Stretching,  Progressive Resistive, Aerobic exercise   Manual Techniques:  N/A   Modalities:  Cryotherapy    Functional: Proprioception/Dynamic stability, Functional rehab     Thank you for referring this patient to [facility].    Krystal Eaton, PT      TM walk/jog 10 min  2 min walk/1 min jog    Elliptical  HEP   Prone quad stretch 10sec x 10   Quad set  EMG 30x    SLR  EMG 30x   2.5lbs   Standing calf raise 30x  65lbs   Mini squat  30x  65lbs   HS bendover  30x  30lbs    Bridge   Green Ball  3x10   SLB  Rebounder 30x   4kg   Glider 3 way  1x10   Cybex LP  Single leg 3x10  60lbs   Cybex HS curl  Single leg 3x10  25lbs   Step up 3x10  6 inch - 20lbs   Step down  3x10  4 inch - 20lbs   Double leg hop   Lateral, forward, scissor jump 2x10 ea

## 2014-09-23 NOTE — Progress Notes (Signed)
SPORTS PT CHARGE:   Evaluation:  PT CODE 3330 - CPT CODE PT - 97002 - Re-Eval (30 minutes)  Therapeutic Procedures:  PT CODE 314 - CPT CODE PT - 97110 - Therapeutic Exercise (ea 15 min) x 2 units  Total Minute Time:  60 min  Status/POC: Status complete

## 2014-10-07 ENCOUNTER — Ambulatory Visit: Payer: Self-pay | Admitting: Physical Therapy

## 2014-10-07 DIAGNOSIS — M25562 Pain in left knee: Secondary | ICD-10-CM

## 2014-10-07 NOTE — Progress Notes (Signed)
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 reports that he hs not had minimal discomfort with the HEP over the last week.  The left LE still fatigues faster than the right LE, but this is getting better    Objective:  ROM - same as last visit, Left, Knee, EXT 0, FLEX 140  Strength - Improved,  Per Ther Ex flowsheet  Function: - Improved  Education:  Updated HEP, Verbal cues for ther ex    Objective       TM walk/jog 10 min  2 min walk/1 min jog    Elliptical  HEP   Prone quad stretch 10sec x 10   Quad set  EMG 30x    SLR  EMG 30x   2.5lbs   Standing calf raise 30x  65lbs   Mini squat  30x  65lbs   HS bendover  30x  30lbs    Bridge   Green Ball  3x10   SLB  Rebounder 30x   4kg   Glider 3 way  1x10   Cybex LP  Single leg 3x10  60lbs   Cybex HS curl  Single leg 3x10  25lbs   Step up 3x10  6 inch - 20lbs   Step down  3x10  4 inch - 20lbs   Double leg hop   Lateral, forward  2x10 ea            Treatment:  Ther Exercise per flowsheet    Assessment:          Plan of Care:  Continue per Plan of care -  As written    [facility]    Krystal EatonAdam Haisley Arens, PT

## 2014-10-07 NOTE — Progress Notes (Signed)
SPORTS PT CHARGE:   Therapeutic Procedures:  PT CODE 314 - CPT CODE PT - 97110 - Therapeutic Exercise (ea 15 min) x 3 units  PT CODE 304 - CPT CODE PT - 97530 - Therapeutic Activites functional (ea. 15 minutes) x 1 unit  Total Minute Time: 60 min  Status/POC:

## 2014-10-14 ENCOUNTER — Ambulatory Visit: Payer: Self-pay | Admitting: Physical Therapy

## 2014-10-14 DIAGNOSIS — M25562 Pain in left knee: Secondary | ICD-10-CM

## 2014-10-14 NOTE — Progress Notes (Signed)
SPORTS PT CHARGE:   Therapeutic Procedures:  PT CODE 314 - CPT CODE PT - 97110 - Therapeutic Exercise (ea 15 min) x 3 units  PT CODE 304 - CPT CODE PT - 97530 - Therapeutic Activites functional (ea. 15 minutes) x 1 unit  Total Minute Time: 60 min  Status/POC:

## 2014-10-14 NOTE — Progress Notes (Signed)
[facility]  PT Note    Jose Keller   [id number]     Diagnosis: Zenia ResidesLeft knee patellar OCD fx ORIF on 04/01/14  Hardware removal on 06/01/14    Subjective:  Pain Score:  0-1  Pain: Improved, pt. reports that he hs not had minimal discomfort with the HEP over the last week.  The left LE still fatigues faster than the right LE, but this is getting better    Objective:  ROM - same as last visit, Left, Knee, EXT 0, FLEX 140  Strength - Improved,  Per Ther Ex flowsheet  Function: - Improved  Education:  Updated HEP, Verbal cues for ther ex    Objective       TM walk/jog 10 min  2 min walk/1 min jog    Elliptical  HEP   Prone quad stretch 10sec x 10   Quad set  EMG 30x    SLR  EMG 30x   2.5lbs   Standing calf raise 30x  65lbs   Mini squat  30x  65lbs   HS bendover  30x  30lbs    Bridge   Green Ball  3x10   SLB  Rebounder 30x   4kg   Glider 3 way  1x10   Cybex LP  Single leg 3x10  60lbs   Cybex HS curl  Single leg 3x10  25lbs   Step up 3x10  6 inch - 20lbs   Step down  3x10  4 inch - 20lbs   Double leg hop   Lateral, forward  2x10 ea    Single leg hop; single leg triple hop  10x        Treatment:  Ther Exercise per flowsheet    Assessment:   Pt. Able to progress his functional activities with single leg hopping.  He does still demonstrate deficits with quad control when landing at this time.        Plan of Care:  Continue per Plan of care -  As written    [facility]    Krystal EatonAdam Enda Santo, PT

## 2014-10-21 ENCOUNTER — Ambulatory Visit: Payer: Self-pay | Admitting: Physical Therapy

## 2014-10-21 DIAGNOSIS — M25562 Pain in left knee: Secondary | ICD-10-CM

## 2014-10-21 NOTE — Progress Notes (Signed)
SPORTS PT CHARGE:   Therapeutic Procedures:  PT CODE 314 - CPT CODE PT - 97110 - Therapeutic Exercise (ea 15 min) x 3 units  Total Minute Time:  45 min  Status/POC: DC

## 2014-10-24 NOTE — Progress Notes (Signed)
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 reports steady improvements with left knee discomfort while performing his HEP.  He does feel that the left LE fatigues quicker than his right LE, but he feels progress with this as well.  No c/o pain at start of session today.     Objective:  ROM - same as last visit, Left, Knee, EXT 0, FLEX 140  Strength - Improved,  Per Ther Ex flowsheet  Function: - Improved  Education:  Updated HEP, Verbal cues for ther ex    Objective       TM walk/jog 10 min  2 min walk/1 min jog    Elliptical  HEP   Prone quad stretch 10sec x 10   Quad set  EMG 30x    SLR  EMG 30x   2.5lbs   Standing calf raise 30x  65lbs   Mini squat  30x  65lbs   HS bendover  30x  30lbs    Bridge   Green Ball  3x10   SLB  Rebounder 30x   4kg   Glider 3 way  1x10   Cybex LP  Single leg 3x10  60lbs   Cybex HS curl  Single leg 3x10  25lbs   Step up 3x10  6 inch - 20lbs   Step down  3x10  4 inch - 20lbs   Double leg hop   Lateral, forward  2x10 ea    Single leg hop; single leg triple hop  10x        Treatment:  Ther Exercise per flowsheet    Assessment:   Pt. Still has some deficits with power during SL hopping activities today.  He has achieved all other goals for supervised PT at this time.  Pt. Is done with his spring semester at school and will be leaving the area for the summer.  He was instructed on progression for HEP and will contact the clinic with questions/concerns.  D/C to HEP only.         Plan of Care:  D/C to HEP only.     [facility]    Jose EatonAdam Nychelle Keller, PT

## 2015-05-23 ENCOUNTER — Other Ambulatory Visit: Payer: Self-pay | Admitting: Orthopedic Surgery

## 2015-05-23 ENCOUNTER — Ambulatory Visit: Payer: Self-pay | Admitting: Orthopedic Surgery

## 2015-05-23 ENCOUNTER — Encounter: Payer: Self-pay | Admitting: Orthopedic Surgery

## 2015-05-23 VITALS — BP 134/63 | HR 82 | Ht 71.0 in | Wt 185.0 lb

## 2015-05-23 DIAGNOSIS — S83241A Other tear of medial meniscus, current injury, right knee, initial encounter: Secondary | ICD-10-CM

## 2015-05-23 DIAGNOSIS — M25561 Pain in right knee: Secondary | ICD-10-CM

## 2015-05-23 DIAGNOSIS — S83006D Unspecified dislocation of unspecified patella, subsequent encounter: Secondary | ICD-10-CM

## 2015-05-23 NOTE — Progress Notes (Signed)
CHIEF COMPLAINT: Right knee pain    INTERVAL HISTORY: Patient presents today for evaluation of pain in the right knee.  He is roughly 1 year following open reduction internal fixation of his left patella following fracture after dislocation.  He presents today for evaluation of pain in the left knee.  He states that this pain is actually been bothering him for 10-12 years or so.  He does not recall any specific injury.  He describes a sharp, mechanical pain anteromedial based.  This is worse with activity such as climbing stairs as well as standing from a seated position.  He has done physical therapy.  He has taken anti-inflammatories at therapeutic dislocations.    PFSH:  Since last visit no change.    ROS:  Since last visit no change.    MEDICATION:  Since last visit no new meds.    ALLERGIES:  As per initial evaluation.    PHYSICAL EXAM: His right knee today shows no effusion.  No increased warmth or erythema.  He can extend the right knee actively to 0.  Flexion to 130.  Mild patellofemoral crepitus and anteromedial discomfort at maximal flexion.  Negative apprehension test.  Good range of motion about the right hip without any reproduction of his usual discomfort.    IMAGING: I did order 3 views of his right knee and these were reviewed.  These do not show any obvious acute bony abnormalities.    ASSESSMENT: Concern for possible meniscal pathology and/or patellar articular cartilage damage    PLAN: Again, patient's symptoms are long-standing.  He has done therapy and taking anti-inflammatories.  We did discuss further imaging studies, although patient would like to hold off on this for the time being.  He will continue with his activities as tolerated.  He will contact me with any further problems or questions.    ORDERS TODAY: 3 views right knee    ORDERS NEXT VISIT:    PERCENT OF TEMPORARY IMPAIRMENT:

## 2017-03-31 DIAGNOSIS — J019 Acute sinusitis, unspecified: Secondary | ICD-10-CM | POA: Diagnosis not present

## 2017-03-31 DIAGNOSIS — R5383 Other fatigue: Secondary | ICD-10-CM | POA: Diagnosis not present

## 2017-03-31 DIAGNOSIS — B9689 Other specified bacterial agents as the cause of diseases classified elsewhere: Secondary | ICD-10-CM | POA: Diagnosis not present

## 2017-03-31 DIAGNOSIS — H66006 Acute suppurative otitis media without spontaneous rupture of ear drum, recurrent, bilateral: Secondary | ICD-10-CM | POA: Diagnosis not present

## 2017-03-31 DIAGNOSIS — R5381 Other malaise: Secondary | ICD-10-CM | POA: Diagnosis not present

## 2017-04-11 DIAGNOSIS — R6889 Other general symptoms and signs: Secondary | ICD-10-CM | POA: Diagnosis not present

## 2017-05-13 DIAGNOSIS — R748 Abnormal levels of other serum enzymes: Secondary | ICD-10-CM | POA: Diagnosis not present

## 2017-05-13 DIAGNOSIS — M545 Low back pain: Secondary | ICD-10-CM | POA: Diagnosis not present

## 2017-05-13 DIAGNOSIS — F324 Major depressive disorder, single episode, in partial remission: Secondary | ICD-10-CM | POA: Diagnosis not present

## 2017-05-13 DIAGNOSIS — Z6824 Body mass index (BMI) 24.0-24.9, adult: Secondary | ICD-10-CM | POA: Diagnosis not present

## 2017-12-04 DIAGNOSIS — H5213 Myopia, bilateral: Secondary | ICD-10-CM | POA: Diagnosis not present

## 2018-01-22 DIAGNOSIS — Z6825 Body mass index (BMI) 25.0-25.9, adult: Secondary | ICD-10-CM | POA: Diagnosis not present

## 2018-01-22 DIAGNOSIS — M545 Low back pain: Secondary | ICD-10-CM | POA: Diagnosis not present

## 2018-05-04 DIAGNOSIS — Z6824 Body mass index (BMI) 24.0-24.9, adult: Secondary | ICD-10-CM | POA: Diagnosis not present

## 2018-05-04 DIAGNOSIS — Z0000 Encounter for general adult medical examination without abnormal findings: Secondary | ICD-10-CM | POA: Diagnosis not present

## 2018-05-04 DIAGNOSIS — Z113 Encounter for screening for infections with a predominantly sexual mode of transmission: Secondary | ICD-10-CM | POA: Diagnosis not present

## 2018-05-04 DIAGNOSIS — Z13220 Encounter for screening for lipoid disorders: Secondary | ICD-10-CM | POA: Diagnosis not present

## 2018-05-04 DIAGNOSIS — Z131 Encounter for screening for diabetes mellitus: Secondary | ICD-10-CM | POA: Diagnosis not present

## 2019-02-03 ENCOUNTER — Ambulatory Visit: Payer: Self-pay | Admitting: Family Medicine

## 2019-02-03 ENCOUNTER — Other Ambulatory Visit: Payer: Self-pay

## 2019-02-04 ENCOUNTER — Encounter: Payer: Self-pay | Admitting: Family Medicine

## 2019-02-04 ENCOUNTER — Ambulatory Visit (INDEPENDENT_AMBULATORY_CARE_PROVIDER_SITE_OTHER): Payer: No Typology Code available for payment source | Admitting: Family Medicine

## 2019-02-04 VITALS — BP 122/80 | HR 69 | Ht 71.0 in | Wt 191.4 lb

## 2019-02-04 DIAGNOSIS — Z0000 Encounter for general adult medical examination without abnormal findings: Secondary | ICD-10-CM

## 2019-02-04 DIAGNOSIS — Z23 Encounter for immunization: Secondary | ICD-10-CM

## 2019-02-04 LAB — URINALYSIS, ROUTINE W REFLEX MICROSCOPIC
Bilirubin Urine: NEGATIVE
Hgb urine dipstick: NEGATIVE
Ketones, ur: NEGATIVE
Leukocytes,Ua: NEGATIVE
Nitrite: NEGATIVE
RBC / HPF: NONE SEEN (ref 0–?)
Specific Gravity, Urine: 1.025 (ref 1.000–1.030)
Total Protein, Urine: NEGATIVE
Urine Glucose: NEGATIVE
Urobilinogen, UA: 0.2 (ref 0.0–1.0)
pH: 6.5 (ref 5.0–8.0)

## 2019-02-04 LAB — COMPREHENSIVE METABOLIC PANEL
ALT: 15 U/L (ref 0–53)
AST: 16 U/L (ref 0–37)
Albumin: 4.7 g/dL (ref 3.5–5.2)
Alkaline Phosphatase: 57 U/L (ref 39–117)
BUN: 17 mg/dL (ref 6–23)
CO2: 32 mEq/L (ref 19–32)
Calcium: 9.6 mg/dL (ref 8.4–10.5)
Chloride: 101 mEq/L (ref 96–112)
Creatinine, Ser: 0.94 mg/dL (ref 0.40–1.50)
GFR: 97.01 mL/min (ref >60.00)
Glucose, Bld: 91 mg/dL (ref 70–99)
Potassium: 4.1 mEq/L (ref 3.5–5.1)
Sodium: 138 mEq/L (ref 135–145)
Total Bilirubin: 0.5 mg/dL (ref 0.2–1.2)
Total Protein: 7 g/dL (ref 6.0–8.3)

## 2019-02-04 LAB — CBC
HCT: 44.2 % (ref 39.0–52.0)
Hemoglobin: 15.3 g/dL (ref 13.0–17.0)
MCHC: 34.7 g/dL (ref 30.0–36.0)
MCV: 87 fl (ref 78.0–100.0)
Platelets: 216 10*3/uL (ref 150.0–400.0)
RBC: 5.08 Mil/uL (ref 4.22–5.81)
RDW: 12.9 % (ref 11.5–15.5)
WBC: 5 10*3/uL (ref 4.0–10.5)

## 2019-02-04 LAB — LIPID PANEL
Cholesterol: 203 mg/dL — ABNORMAL HIGH (ref 0–200)
HDL: 57.3 mg/dL (ref >39.00)
LDL Cholesterol: 130 mg/dL — ABNORMAL HIGH (ref 0–99)
NonHDL: 145.92
Total CHOL/HDL Ratio: 4
Triglycerides: 78 mg/dL (ref 0.0–149.0)
VLDL: 15.6 mg/dL (ref 0.0–40.0)

## 2019-02-04 LAB — TSH: TSH: 2.31 u[IU]/mL (ref 0.35–4.50)

## 2019-02-04 LAB — LDL CHOLESTEROL, DIRECT: Direct LDL: 130 mg/dL

## 2019-02-04 NOTE — Progress Notes (Signed)
Established Patient Office Visit  Subjective:  Patient ID: Craig Andrade, male    DOB: August 03, 1990  Age: 28 y.o. MRN: [id number]  CC:  Chief Complaint  Patient presents with  . Annual Exam  . Establish Care    HPI Craig Andrade presents for the establishment of care and a complete physical exam.  Patient enjoys good health as far as he knows.  He has taken Prozac for the last few years with good result with an elevated mood and decreased anxiety and depression.  He tells of a history of elevated liver enzymes that have been associated with elevated triglycerides.  This does not run in his family as far as he knows.  Denies drinking more than 2 alcoholic beverages in a given setting and that is usually only on the weekends when he is with friends.  Parents are in good health back in [place].  He has a [employer] found to work in this area.  He is single.  He is active by running 3 times a week.  He is in the process of trying to find a dentist.  He is fasting today.  He does have a significant other.  History reviewed. No pertinent past medical history.  History reviewed. No pertinent surgical history.  History reviewed. No pertinent family history.  Social History   Socioeconomic History  . Marital status: Single    Spouse name: Not on file  . Number of children: Not on file  . Years of education: Not on file  . Highest education level: Not on file  Occupational History  . Not on file  Social Needs  . Financial resource strain: Not on file  . Food insecurity    Worry: Not on file    Inability: Not on file  . Transportation needs    Medical: Not on file    Non-medical: Not on file  Tobacco Use  . Smoking status: Never Smoker  . Smokeless tobacco: Never Used  Substance and Sexual Activity  . Alcohol use: Not on file    Comment: drinks no more than 2 beers on occassion.   . Drug use: Not on file    Comment: No illicit drug use  . Sexual activity:  Not on file  Lifestyle  . Physical activity    Days per week: Not on file    Minutes per session: Not on file  . Stress: Not on file  Relationships  . Social Herbalist on phone: Not on file    Gets together: Not on file    Attends religious service: Not on file    Active member of club or organization: Not on file    Attends meetings of clubs or organizations: Not on file    Relationship status: Not on file  . Intimate partner violence    Fear of current or ex partner: Not on file    Emotionally abused: Not on file    Physically abused: Not on file    Forced sexual activity: Not on file  Other Topics Concern  . Not on file  Social History Narrative  . Not on file    Outpatient Medications Prior to Visit  Medication Sig Dispense Refill  . FLUoxetine (PROZAC) 20 MG tablet Take 1 tablet by mouth daily.     No facility-administered medications prior to visit.     No Known Allergies  ROS Review of Systems  Constitutional: Negative.   HENT:  Negative.   Eyes: Negative for photophobia and visual disturbance.  Respiratory: Negative.   Cardiovascular: Negative.   Gastrointestinal: Negative.   Endocrine: Negative for polyphagia and polyuria.  Genitourinary: Negative.   Musculoskeletal: Negative for gait problem and joint swelling.  Skin: Negative for pallor and rash.  Allergic/Immunologic: Negative for immunocompromised state.  Neurological: Negative for weakness, light-headedness and headaches.  Hematological: Does not bruise/bleed easily.     Depression screen [facility] 2/9 02/04/2019  Decreased Interest 1  Down, Depressed, Hopeless 0  PHQ - 2 Score 1  Altered sleeping 0  Tired, decreased energy 1  Change in appetite 1  Feeling bad or failure about yourself  0  Trouble concentrating 2  Moving slowly or fidgety/restless 0  Suicidal thoughts 0  PHQ-9 Score 5      Objective:    Physical Exam  Constitutional: He is oriented to person, place, and time. He  appears well-developed and well-nourished. No distress.  HENT:  Head: Normocephalic and atraumatic.  Right Ear: External ear normal.  Left Ear: External ear normal.  Mouth/Throat: Oropharynx is clear and moist. No oropharyngeal exudate.  Eyes: Pupils are equal, round, and reactive to light. Conjunctivae are normal. Right eye exhibits no discharge. Left eye exhibits no discharge. No scleral icterus.  Neck: Neck supple. No JVD present. No tracheal deviation present.  Cardiovascular: Normal rate, regular rhythm and normal heart sounds.  Pulmonary/Chest: Effort normal and breath sounds normal. No stridor.  Abdominal: Soft. Bowel sounds are normal. He exhibits no distension. There is no abdominal tenderness. There is no rebound and no guarding. Hernia confirmed negative in the right inguinal area and confirmed negative in the left inguinal area.  Genitourinary: Right testis shows no mass, no swelling and no tenderness. Right testis is descended. Left testis shows no mass, no swelling and no tenderness. Left testis is descended. Circumcised. No hypospadias, penile erythema or penile tenderness. No discharge found.  Musculoskeletal:        General: No edema.  Lymphadenopathy:       Right: No inguinal adenopathy present.       Left: No inguinal adenopathy present.  Neurological: He is alert and oriented to person, place, and time.  Skin: Skin is warm and dry. No rash noted. He is not diaphoretic. No erythema.  Psychiatric: He has a normal mood and affect. His behavior is normal.    BP 122/80   Pulse 69   Ht '5\' 11"'  (1.803 m)   Wt 191 lb 6 oz (86.8 kg)   SpO2 96%   BMI 26.69 kg/m  Wt Readings from Last 3 Encounters:  02/04/19 191 lb 6 oz (86.8 kg)   BP Readings from Last 3 Encounters:  02/04/19 122/80   Guideline developer:  UpToDate (see UpToDate for funding source) Date Released: June 2014  Health Maintenance Due  Topic Date Due  . HIV Screening  02/07/2008    There are no  preventive care reminders to display for this patient.  No results found for: TSH No results found for: WBC, HGB, HCT, MCV, PLT No results found for: NA, K, CHLORIDE, CO2, GLUCOSE, BUN, CREATININE, BILITOT, ALKPHOS, AST, ALT, PROT, ALBUMIN, CALCIUM, ANIONGAP, EGFR, GFR No results found for: CHOL No results found for: HDL No results found for: LDLCALC No results found for: TRIG No results found for: CHOLHDL No results found for: HGBA1C    Assessment & Plan:   Problem List Items Addressed This Visit    None    Visit Diagnoses  Healthcare maintenance    -  Primary   Relevant Orders   CBC   Comprehensive metabolic panel   Lipid panel   LDL cholesterol, direct   TSH   Urinalysis, Routine w reflex microscopic   HIV Antibody (routine testing w rflx)   Need for influenza vaccination       Relevant Orders   Flu Vaccine QUAD 36+ mos IM (Completed)      No orders of the defined types were placed in this encounter.   Follow-up: Return in about 1 year (around 02/04/2020), or if symptoms worsen or fail to improve.    Patient was given information on health maintenance and disease prevention.  He was also given information on high triglycerides and dietary modifications that can help.  He plans to send me records regarding his history of elevated LFTs.

## 2019-02-04 NOTE — Patient Instructions (Signed)
Preventive Care 19-26 Years Old, Male Preventive care refers to lifestyle choices and visits with your health care provider that can promote health and wellness. This includes:  A yearly physical exam. This is also called an annual well check.  Regular dental and eye exams.  Immunizations.  Screening for certain conditions.  Healthy lifestyle choices, such as eating a healthy diet, getting regular exercise, not using drugs or products that contain nicotine and tobacco, and limiting alcohol use. What can I expect for my preventive care visit? Physical exam Your health care provider will check:  Height and weight. These may be used to calculate body mass index (BMI), which is a measurement that tells if you are at a healthy weight.  Heart rate and blood pressure.  Your skin for abnormal spots. Counseling Your health care provider may ask you questions about:  Alcohol, tobacco, and drug use.  Emotional well-being.  Home and relationship well-being.  Sexual activity.  Eating habits.  Work and work Statistician. What immunizations do I need?  Influenza (flu) vaccine  This is recommended every year. Tetanus, diphtheria, and pertussis (Tdap) vaccine  You may need a Td booster every 10 years. Varicella (chickenpox) vaccine  You may need this vaccine if you have not already been vaccinated. Human papillomavirus (HPV) vaccine  If recommended by your health care provider, you may need three doses over 6 months. Measles, mumps, and rubella (MMR) vaccine  You may need at least one dose of MMR. You may also need a second dose. Meningococcal conjugate (MenACWY) vaccine  One dose is recommended if you are 45-76 years old and a Market researcher living in a residence hall, or if you have one of several medical conditions. You may also need additional booster doses. Pneumococcal conjugate (PCV13) vaccine  You may need this if you have certain conditions and were not  previously vaccinated. Pneumococcal polysaccharide (PPSV23) vaccine  You may need one or two doses if you smoke cigarettes or if you have certain conditions. Hepatitis A vaccine  You may need this if you have certain conditions or if you travel or work in places where you may be exposed to hepatitis A. Hepatitis B vaccine  You may need this if you have certain conditions or if you travel or work in places where you may be exposed to hepatitis B. Haemophilus influenzae type b (Hib) vaccine  You may need this if you have certain risk factors. You may receive vaccines as individual doses or as more than one vaccine together in one shot (combination vaccines). Talk with your health care provider about the risks and benefits of combination vaccines. What tests do I need? Blood tests  Lipid and cholesterol levels. These may be checked every 5 years starting at age 17.  Hepatitis C test.  Hepatitis B test. Screening   Diabetes screening. This is done by checking your blood sugar (glucose) after you have not eaten for a while (fasting).  Sexually transmitted disease (STD) testing. Talk with your health care provider about your test results, treatment options, and if necessary, the need for more tests. Follow these instructions at home: Eating and drinking   Eat a diet that includes fresh fruits and vegetables, whole grains, lean protein, and low-fat dairy products.  Take vitamin and mineral supplements as recommended by your health care provider.  Do not drink alcohol if your health care provider tells you not to drink.  If you drink alcohol: ? Limit how much you have to 0-2  drinks a day. ? Be aware of how much alcohol is in your drink. In the [place] one drink equals one 12 oz bottle of beer (355 mL), one 5 oz glass of wine (148 mL), or one 1 oz glass of hard liquor (44 mL). Lifestyle  Take daily care of your teeth and gums.  Stay active. Exercise for at least 30 minutes on 5 or  more days each week.  Do not use any products that contain nicotine or tobacco, such as cigarettes, e-cigarettes, and chewing tobacco. If you need help quitting, ask your health care provider.  If you are sexually active, practice safe sex. Use a condom or other form of protection to prevent STIs (sexually transmitted infections). What's next?  Go to your health care provider once a year for a well check visit.  Ask your health care provider how often you should have your eyes and teeth checked.  Stay up to date on all vaccines. This information is not intended to replace advice given to you by your health care provider. Make sure you discuss any questions you have with your health care provider. Document Released: 07/23/2001 Document Revised: 05/21/2018 Document Reviewed: 05/21/2018 Elsevier Patient Education  2020 Else[employer] Inc[employer]  Health Maintenance, Male Adopting a healthy lifestyle and getting preventive care are important in promoting health and wellness. Ask your health care provider about:  The right schedule for you to have regular tests and exams.  Things you can do on your own to prevent diseases and keep yourself healthy. What should I know about diet, weight, and exercise? Eat a healthy diet   Eat a diet that includes plenty of vegetables, fruits, low-fat dairy products, and lean protein.  Do not eat a lot of foods that are high in solid fats, added sugars, or sodium. Maintain a healthy weight Body mass index (BMI) is a measurement that can be used to identify possible weight problems. It estimates body fat based on height and weight. Your health care provider can help determine your BMI and help you achieve or maintain a healthy weight. Get regular exercise Get regular exercise. This is one of the most important things you can do for your health. Most adults should:  Exercise for at least 150 minutes each week. The exercise should increase your heart rate and make you  sweat (moderate-intensity exercise).  Do strengthening exercises at least twice a week. This is in addition to the moderate-intensity exercise.  Spend less time sitting. Even light physical activity can be beneficial. Watch cholesterol and blood lipids Have your blood tested for lipids and cholesterol at 26 years of age, then have this test every 5 years. You may need to have your cholesterol levels checked more often if:  Your lipid or cholesterol levels are high.  You are older than 26 years of age.  You are at high risk for heart disease. What should I know about cancer screening? Many types of cancers can be detected early and may often be prevented. Depending on your health history and family history, you may need to have cancer screening at various ages. This may include screening for:  Colorectal cancer.  Prostate cancer.  Skin cancer.  Lung cancer. What should I know about heart disease, diabetes, and high blood pressure? Blood pressure and heart disease  High blood pressure causes heart disease and increases the risk of stroke. This is more likely to develop in people who have high blood pressure readings, are of African descent, or are   overweight.  Talk with your health care provider about your target blood pressure readings.  Have your blood pressure checked: ? Every 3-5 years if you are 18-39 years of age. ? Every year if you are 40 years old or older.  If you are between the ages of 65 and 75 and are a current or former smoker, ask your health care provider if you should have a one-time screening for abdominal aortic aneurysm (AAA). Diabetes Have regular diabetes screenings. This checks your fasting blood sugar level. Have the screening done:  Once every three years after age 45 if you are at a normal weight and have a low risk for diabetes.  More often and at a younger age if you are overweight or have a high risk for diabetes. What should I know about  preventing infection? Hepatitis B If you have a higher risk for hepatitis B, you should be screened for this virus. Talk with your health care provider to find out if you are at risk for hepatitis B infection. Hepatitis C Blood testing is recommended for:  Everyone born from 1945 through 1965.  Anyone with known risk factors for hepatitis C. Sexually transmitted infections (STIs)  You should be screened each year for STIs, including gonorrhea and chlamydia, if: ? You are sexually active and are younger than 26 years of age. ? You are older than 26 years of age and your health care provider tells you that you are at risk for this type of infection. ? Your sexual activity has changed since you were last screened, and you are at increased risk for chlamydia or gonorrhea. Ask your health care provider if you are at risk.  Ask your health care provider about whether you are at high risk for HIV. Your health care provider may recommend a prescription medicine to help prevent HIV infection. If you choose to take medicine to prevent HIV, you should first get tested for HIV. You should then be tested every 3 months for as long as you are taking the medicine. Follow these instructions at home: Lifestyle  Do not use any products that contain nicotine or tobacco, such as cigarettes, e-cigarettes, and chewing tobacco. If you need help quitting, ask your health care provider.  Do not use street drugs.  Do not share needles.  Ask your health care provider for help if you need support or information about quitting drugs. Alcohol use  Do not drink alcohol if your health care provider tells you not to drink.  If you drink alcohol: ? Limit how much you have to 0-2 drinks a day. ? Be aware of how much alcohol is in your drink. In the [place] one drink equals one 12 oz bottle of beer (355 mL), one 5 oz glass of wine (148 mL), or one 1 oz glass of hard liquor (44 mL). General instructions  Schedule  regular health, dental, and eye exams.  Stay current with your vaccines.  Tell your health care provider if: ? You often feel depressed. ? You have ever been abused or do not feel safe at home. Summary  Adopting a healthy lifestyle and getting preventive care are important in promoting health and wellness.  Follow your health care provider's instructions about healthy diet, exercising, and getting tested or screened for diseases.  Follow your health care provider's instructions on monitoring your cholesterol and blood pressure. This information is not intended to replace advice given to you by your health care provider. Make sure you discuss   any questions you have with your health care provider. Document Released: 11/23/2007 Document Revised: 05/20/2018 Document Reviewed: 05/20/2018 Elsevier Patient Education  [place].  High Triglycerides Eating Plan Triglycerides are a type of fat in the blood. High levels of triglycerides can increase your risk of heart disease and stroke. If your triglyceride levels are high, choosing the right foods can help lower your triglycerides and keep your heart healthy. Work with your health care provider or a diet and nutrition specialist (dietitian) to develop an eating plan that is right for you. What are tips for following this plan? General guidelines   Lose weight, if you are overweight. For most people, losing 5-10 lbs (2-5 kg) helps lower triglyceride levels. A weight-loss plan may include. ? 30 minutes of exercise at least 5 days a week. ? Reducing the amount of calories, sugar, and fat you eat.  Eat a wide variety of fresh fruits, vegetables, and whole grains. These foods are high in fiber.  Eat foods that contain healthy fats, such as fatty fish, nuts, seeds, and olive oil.  Avoid foods that are high in added sugar, added salt (sodium), saturated fat, and trans fat.  Avoid low-fiber, refined carbohydrates such as white bread,  crackers, noodles, and white rice.  Avoid foods with partially hydrogenated oils (trans fats), such as fried foods or stick margarine.  Limit alcohol intake to no more than 1 drink a day for nonpregnant women and 2 drinks a day for men. One drink equals 12 oz of beer, 5 oz of wine, or 1 oz of hard liquor. Your health care provider may recommend that you drink less depending on your overall health. Reading food labels  Check food labels for the amount of saturated fat. Choose foods with no or very little saturated fat.  Check food labels for the amount of trans fat. Choose foods with no trans fat.  Check food labels for the amount of cholesterol. Choose foods low in cholesterol. Ask your dietitian how much cholesterol you should have each day.  Check food labels for the amount of sodium. Choose foods with less than 140 milligrams (mg) per serving. Shopping  Buy dairy products labeled as nonfat (skim) or low-fat (1%).  Avoid buying processed or prepackaged foods. These are often high in added sugar, sodium, and fat. Cooking  Choose healthy fats when cooking, such as olive oil or canola oil.  Cook foods using lower fat methods, such as baking, broiling, boiling, or grilling.  Make your own sauces, dressings, and marinades when possible, instead of buying them. Store-bought sauces, dressings, and marinades are often high in sodium and sugar. Meal planning  Eat more home-cooked food and less restaurant, buffet, and fast food.  Eat fatty fish at least 2 times each week. Examples of fatty fish include salmon, trout, mackerel, tuna, and herring.  If you eat whole eggs, do not eat more than 3 egg yolks per week. What foods are recommended? The items listed may not be a complete list. Talk with your dietitian about what dietary choices are best for you. Grains Whole wheat or whole grain breads, crackers, cereals, and pasta. Unsweetened oatmeal. Bulgur. Barley. Quinoa. Brown rice. Whole  wheat flour tortillas. Vegetables Fresh or frozen vegetables. Low-sodium canned vegetables. Fruits All fresh, canned (in natural juice), or frozen fruits. Meats and other protein foods Skinless chicken or [place]. Ground chicken or [place]. Lean cuts of pork, trimmed of fat. Fish and seafood, especially salmon, trout, and herring. Egg whites. Dried  beans, peas, or lentils. Unsalted nuts or seeds. Unsalted canned beans. Natural peanut or almond butter. Dairy Low-fat dairy products. Skim or low-fat (1%) milk. Reduced fat (2%) and low-sodium cheese. Low-fat ricotta cheese. Low-fat cottage cheese. Plain, low-fat yogurt. Fats and oils Tub margarine without trans fats. Light or reduced-fat mayonnaise. Light or reduced-fat salad dressings. Avocado. Safflower, olive, sunflower, soybean, and canola oils. What foods are not recommended? The items listed may not be a complete list. Talk with your dietitian about what dietary choices are best for you. Grains White bread. White (regular) pasta. White rice. Cornbread. Bagels. Pastries. Crackers that contain trans fat. Vegetables Creamed or fried vegetables. Vegetables in a cheese sauce. Fruits Sweetened dried fruit. Canned fruit in syrup. Fruit juice. Meats and other protein foods Fatty cuts of meat. Ribs. Chicken wings. Berniece Salines. Sausage. Bologna. Salami. Chitterlings. Fatback. Hot dogs. Bratwurst. Packaged lunch meats. Dairy Whole or reduced-fat (2%) milk. Half-and-half. Cream cheese. Full-fat or sweetened yogurt. Full-fat cheese. Nondairy creamers. Whipped toppings. Processed cheese or cheese spreads. Cheese curds. Beverages Alcohol. Sweetened drinks, such as soda, lemonade, fruit drinks, or punches. Fats and oils Butter. Stick margarine. Lard. Shortening. Ghee. Bacon fat. Tropical oils, such as coconut, palm kernel, or palm oils. Sweets and desserts Corn syrup. Sugars. Honey. Molasses. Candy. Jam and jelly. Syrup. Sweetened cereals. Cookies. Pies.  Cakes. Donuts. Muffins. Ice cream. Condiments Store-bought sauces, dressings, and marinades that are high in sugar, such as ketchup and barbecue sauce. Summary  High levels of triglycerides can increase the risk of heart disease and stroke. Choosing the right foods can help lower your triglycerides.  Eat plenty of fresh fruits, vegetables, and whole grains. Choose low-fat dairy and lean meats. Eat fatty fish at least twice a week.  Avoid processed and prepackaged foods with added sugar, sodium, saturated fat, and trans fat.  If you need suggestions or have questions about what types of food are good for you, talk with your health care provider or a dietitian. This information is not intended to replace advice given to you by your health care provider. Make sure you discuss any questions you have with your health care provider. Document Released: 03/14/2004 Document Revised: 05/09/2017 Document Reviewed: 07/30/2016 Elsevier Patient Education  2020 [employer].

## 2019-02-05 LAB — HIV ANTIBODY (ROUTINE TESTING W REFLEX): HIV 1&2 Ab, 4th Generation: NONREACTIVE

## 2019-05-12 ENCOUNTER — Encounter: Payer: Self-pay | Admitting: Family Medicine

## 2019-05-17 ENCOUNTER — Encounter: Payer: Self-pay | Admitting: Family Medicine

## 2019-05-17 ENCOUNTER — Telehealth (INDEPENDENT_AMBULATORY_CARE_PROVIDER_SITE_OTHER): Payer: No Typology Code available for payment source | Admitting: Family Medicine

## 2019-05-17 VITALS — Ht 71.0 in | Wt 191.5 lb

## 2019-05-17 DIAGNOSIS — F322 Major depressive disorder, single episode, severe without psychotic features: Secondary | ICD-10-CM

## 2019-05-17 MED ORDER — BUPROPION HCL ER (XL) 150 MG PO TB24: 150.0000 mg | ORAL_TABLET | Freq: Every day | ORAL | 1 refills | Status: DC

## 2019-05-17 NOTE — Progress Notes (Addendum)
Established Patient Office Visit  Subjective:  Patient ID: Craig Andrade, male    DOB: 12-19-1990  Age: 28 y.o. MRN: [id number]  CC:  Chief Complaint  Patient presents with  . Follow-up    Prozac, would like to discuss either increasing dose or frequency. Doesnt seem to be workng too well     HPI Craig Andrade presents for follow-up of his depression.  For the last few months things have been getting steadily worse.  He is sad daily.  Lost interest in his normal activities.  His significant other who happens to live close by has suggested that he come by to see me.  He is living alone.  He works from home.  He is can continuing to exercise by running 3 days weekly.  He has not been eating well because he has not had the energy or motivation to cook for himself.  He moved down to this area back in February for a new job.  Things are well with his family back in [place].  He has no close relatives in this area.  New job is going well.  He does not smoke.  When he does drink it is no more than 2 beers.  He does not use  History reviewed. No pertinent past medical history.  History reviewed. No pertinent surgical history.  History reviewed. No pertinent family history.  Social History   Socioeconomic History  . Marital status: Single    Spouse name: Not on file  . Number of children: Not on file  . Years of education: Not on file  . Highest education level: Not on file  Occupational History  . Not on file  Social Needs  . Financial resource strain: Not on file  . Food insecurity    Worry: Not on file    Inability: Not on file  . Transportation needs    Medical: Not on file    Non-medical: Not on file  Tobacco Use  . Smoking status: Never Smoker  . Smokeless tobacco: Never Used  Substance and Sexual Activity  . Alcohol use: Yes    Comment: drinks no more than 2 beers on occassion.   . Drug use: Never    Comment: No illicit drug use  . Sexual activity: Not on  file  Lifestyle  . Physical activity    Days per week: Not on file    Minutes per session: Not on file  . Stress: Not on file  Relationships  . Social Musician on phone: Not on file    Gets together: Not on file    Attends religious service: Not on file    Active member of club or organization: Not on file    Attends meetings of clubs or organizations: Not on file    Relationship status: Not on file  . Intimate partner violence    Fear of current or ex partner: Not on file    Emotionally abused: Not on file    Physically abused: Not on file    Forced sexual activity: Not on file  Other Topics Concern  . Not on file  Social History Narrative  . Not on file    Outpatient Medications Prior to Visit  Medication Sig Dispense Refill  . FLUoxetine (PROZAC) 20 MG tablet Take 1 tablet by mouth daily.     No facility-administered medications prior to visit.     No Known Allergies  ROS Review of Systems  Constitutional: Negative.   HENT: Negative.   Eyes: Negative for photophobia and visual disturbance.  Respiratory: Negative.   Cardiovascular: Negative.   Gastrointestinal: Negative.   Endocrine: Negative for polyphagia and polyuria.  Musculoskeletal: Negative for gait problem and joint swelling.  Psychiatric/Behavioral: Positive for decreased concentration and dysphoric mood. Negative for self-injury and suicidal ideas. The patient is nervous/anxious.    Depression screen [facility] 2/9 05/17/2019 02/04/2019  Decreased Interest 3 1  Down, Depressed, Hopeless 3 0  PHQ - 2 Score 6 1  Altered sleeping 3 0  Tired, decreased energy 3 1  Change in appetite 2 1  Feeling bad or failure about yourself  0 0  Trouble concentrating 3 2  Moving slowly or fidgety/restless 3 0  Suicidal thoughts 0 0  PHQ-9 Score 20 5  Difficult doing work/chores Very difficult -      Objective:    Physical Exam  Ht 5\' 11"  (1.803 m)   Wt 191 lb 8 oz (86.9 kg)   BMI 26.71 kg/m  Wt  Readings from Last 3 Encounters:  05/17/19 191 lb 8 oz (86.9 kg)  02/04/19 191 lb 6 oz (86.8 kg)     There are no preventive care reminders to display for this patient.  There are no preventive care reminders to display for this patient.  Lab Results  Component Value Date   TSH 2.31 02/04/2019   Lab Results  Component Value Date   WBC 5.0 02/04/2019   HGB 15.3 02/04/2019   HCT 44.2 02/04/2019   MCV 87.0 02/04/2019   PLT 216.0 02/04/2019   Lab Results  Component Value Date   NA 138 02/04/2019   K 4.1 02/04/2019   CO2 32 02/04/2019   GLUCOSE 91 02/04/2019   BUN 17 02/04/2019   CREATININE 0.94 02/04/2019   BILITOT 0.5 02/04/2019   ALKPHOS 57 02/04/2019   AST 16 02/04/2019   ALT 15 02/04/2019   PROT 7.0 02/04/2019   ALBUMIN 4.7 02/04/2019   CALCIUM 9.6 02/04/2019   GFR 97.01 02/04/2019   Lab Results  Component Value Date   CHOL 203 (H) 02/04/2019   Lab Results  Component Value Date   HDL 57.30 02/04/2019   Lab Results  Component Value Date   LDLCALC 130 (H) 02/04/2019   Lab Results  Component Value Date   TRIG 78.0 02/04/2019   Lab Results  Component Value Date   CHOLHDL 4 02/04/2019   No results found for: HGBA1C    Assessment & Plan:   Problem List Items Addressed This Visit      Other   Depression, major, single episode, severe ([place]) - Primary   Relevant Medications   buPROPion (WELLBUTRIN XL) 150 MG 24 hr tablet   Other Relevant Orders   Ambulatory referral to Psychology      Meds ordered this encounter  Medications  . buPROPion (WELLBUTRIN XL) 150 MG 24 hr tablet    Sig: Take 1 tablet (150 mg total) by mouth daily.    Dispense:  30 tablet    Refill:  1    Follow-up: Return in about 1 month (around 06/17/2019).  We will augment with Wellbutrin XL.  He will take it in the morning as well.  Discussed that it is another activating antidepressant.  Should help his motivation.  He agrees to start counseling and would like to do so.   Follow-up will be in 1 month.  Libby Maw, MD   Virtual Visit via Video Note  I connected with Darrold SpanZachary M Endo on 05/21/19 at 11:30 AM EST by a video enabled telemedicine application and verified that I am speaking with the correct person using two identifiers.  Location: Patient: home alone Provider:   I discussed the limitations of evaluation and management by telemedicine and the availability of in person appointments. The patient expressed understanding and agreed to proceed.  History of Present Illness:    Observations/Objective:   Assessment and Plan:   Follow Up Instructions:    I discussed the assessment and treatment plan with the patient. The patient was provided an opportunity to ask questions and all were answered. The patient agreed with the plan and demonstrated an understanding of the instructions.   The patient was advised to call back or seek an in-person evaluation if the symptoms worsen or if the condition fails to improve as anticipated.  I provided 25 minutes of non-face-to-face time during this encounter.   Mliss SaxWilliam Alfred Kremer, MD

## 2019-05-18 ENCOUNTER — Telehealth: Payer: No Typology Code available for payment source | Admitting: Family Medicine

## 2019-05-24 ENCOUNTER — Telehealth: Payer: Self-pay

## 2019-05-24 ENCOUNTER — Other Ambulatory Visit: Payer: Self-pay

## 2019-05-24 NOTE — Telephone Encounter (Signed)
Craig Andrade looked into pt's message and saw that pt did have his an appointment. She went it and fixed pt's visit. Informed pt that this was taken care of. He had no additional questions.  Copied from [place] [id number]. Topic: General - Inquiry >> May 24, 2019  9:57 AM Craig Andrade wrote: Reason for CRM:  Pt calling.  States he had a MyChart Visit with Dr. Ethelene Hal the other day and was Rxd medication.  However, he got a message through MyChart that he missed that appointment and pt would like to get this cleared up.

## 2019-06-17 ENCOUNTER — Ambulatory Visit (INDEPENDENT_AMBULATORY_CARE_PROVIDER_SITE_OTHER): Payer: No Typology Code available for payment source | Admitting: Psychology

## 2019-06-17 DIAGNOSIS — F330 Major depressive disorder, recurrent, mild: Secondary | ICD-10-CM | POA: Diagnosis not present

## 2019-07-02 ENCOUNTER — Ambulatory Visit (INDEPENDENT_AMBULATORY_CARE_PROVIDER_SITE_OTHER): Payer: No Typology Code available for payment source | Admitting: Psychology

## 2019-07-02 DIAGNOSIS — F330 Major depressive disorder, recurrent, mild: Secondary | ICD-10-CM

## 2019-07-09 ENCOUNTER — Ambulatory Visit (INDEPENDENT_AMBULATORY_CARE_PROVIDER_SITE_OTHER): Payer: No Typology Code available for payment source | Admitting: Psychology

## 2019-07-09 DIAGNOSIS — F330 Major depressive disorder, recurrent, mild: Secondary | ICD-10-CM | POA: Diagnosis not present

## 2019-07-11 ENCOUNTER — Other Ambulatory Visit: Payer: Self-pay | Admitting: Family Medicine

## 2019-07-11 DIAGNOSIS — F322 Major depressive disorder, single episode, severe without psychotic features: Secondary | ICD-10-CM

## 2019-07-12 NOTE — Telephone Encounter (Signed)
Spoke with pt who verbally understood Rx refill sent in. Pt states that he will give [place] a call to schedule an appt when he is able to check his schedule.

## 2019-07-14 ENCOUNTER — Ambulatory Visit (INDEPENDENT_AMBULATORY_CARE_PROVIDER_SITE_OTHER): Payer: No Typology Code available for payment source | Admitting: Psychology

## 2019-07-14 DIAGNOSIS — F330 Major depressive disorder, recurrent, mild: Secondary | ICD-10-CM | POA: Diagnosis not present

## 2019-07-19 ENCOUNTER — Ambulatory Visit: Payer: No Typology Code available for payment source | Admitting: Family Medicine

## 2019-07-19 ENCOUNTER — Other Ambulatory Visit: Payer: Self-pay

## 2019-07-19 ENCOUNTER — Encounter: Payer: Self-pay | Admitting: Family Medicine

## 2019-07-19 VITALS — BP 126/78 | HR 86 | Temp 97.0°F | Ht 71.0 in | Wt 206.6 lb

## 2019-07-19 DIAGNOSIS — F322 Major depressive disorder, single episode, severe without psychotic features: Secondary | ICD-10-CM | POA: Diagnosis not present

## 2019-07-19 MED ORDER — FLUOXETINE HCL 20 MG PO TABS: 20.0000 mg | ORAL_TABLET | Freq: Every day | ORAL | 0 refills | Status: DC

## 2019-07-19 MED ORDER — BUPROPION HCL ER (XL) 150 MG PO TB24: ORAL_TABLET | ORAL | 0 refills | Status: DC

## 2019-07-19 NOTE — Progress Notes (Signed)
Established Patient Office Visit  Subjective:  Patient ID: Craig Andrade, male    DOB: Apr 11, 1991  Age: 29 y.o. MRN: [id number]  CC:  Chief Complaint  Patient presents with  . Follow-up    refills/follow up on medications, no concerns    HPI OUSMAN DISE presents for follow-up of his depression.  Much improved with the addition of the bupropion XL.  Mood is definitely elevated.  Anxiety is somewhat less improved.  He has started counseling with Delsa Bern.  Feels as though this is helping a good deal.  Work is good.  His relationship is good.  His significant other feels as though she seen an improvement.  He is planning on going back home for his brother's wedding in June but not until he gets a Covid vaccine.  No past medical history on file.  No past surgical history on file.  No family history on file.  Social History   Socioeconomic History  . Marital status: Single    Spouse name: Not on file  . Number of children: Not on file  . Years of education: Not on file  . Highest education level: Not on file  Occupational History  . Not on file  Tobacco Use  . Smoking status: Never Smoker  . Smokeless tobacco: Never Used  Substance and Sexual Activity  . Alcohol use: Yes    Comment: drinks no more than 2 beers on occassion.   . Drug use: Never    Comment: No illicit drug use  . Sexual activity: Not on file  Other Topics Concern  . Not on file  Social History Narrative  . Not on file   Social Determinants of Health   Financial Resource Strain:   . Difficulty of Paying Living Expenses: Not on file  Food Insecurity:   . Worried About Programme researcher, broadcasting/film/video in the Last Year: Not on file  . Ran Out of Food in the Last Year: Not on file  Transportation Needs:   . Lack of Transportation (Medical): Not on file  . Lack of Transportation (Non-Medical): Not on file  Physical Activity:   . Days of Exercise per Week: Not on file  . Minutes of Exercise per Session:  Not on file  Stress:   . Feeling of Stress : Not on file  Social Connections:   . Frequency of Communication with Friends and Family: Not on file  . Frequency of Social Gatherings with Friends and Family: Not on file  . Attends Religious Services: Not on file  . Active Member of Clubs or Organizations: Not on file  . Attends Banker Meetings: Not on file  . Marital Status: Not on file  Intimate Partner Violence:   . Fear of Current or Ex-Partner: Not on file  . Emotionally Abused: Not on file  . Physically Abused: Not on file  . Sexually Abused: Not on file    Outpatient Medications Prior to Visit  Medication Sig Dispense Refill  . buPROPion (WELLBUTRIN XL) 150 MG 24 hr tablet TAKE 1 TABLET(150 MG) BY MOUTH DAILY 30 tablet 1  . FLUoxetine (PROZAC) 20 MG tablet Take 1 tablet by mouth daily.     No facility-administered medications prior to visit.    No Known Allergies  ROS Review of Systems  Constitutional: Negative.   HENT: Negative.   Eyes: Negative for photophobia and visual disturbance.  Respiratory: Negative.   Cardiovascular: Negative.   Gastrointestinal: Negative.  Depression screen [facility] 2/9 07/19/2019 05/17/2019 02/04/2019  Decreased Interest 0 3 1  Down, Depressed, Hopeless 1 3 0  PHQ - 2 Score 1 6 1   Altered sleeping - 3 0  Tired, decreased energy - 3 1  Change in appetite - 2 1  Feeling bad or failure about yourself  - 0 0  Trouble concentrating - 3 2  Moving slowly or fidgety/restless - 3 0  Suicidal thoughts - 0 0  PHQ-9 Score - 20 5  Difficult doing work/chores - Very difficult -    Objective:    Physical Exam  Constitutional: He appears well-developed and well-nourished. No distress.  HENT:  Head: Normocephalic and atraumatic.  Right Ear: External ear normal.  Left Ear: External ear normal.  Eyes: Conjunctivae are normal. Right eye exhibits no discharge. Left eye exhibits no discharge. No scleral icterus.  Pulmonary/Chest:  Effort normal.  Neurological: He is alert.  Skin: He is not diaphoretic.  Psychiatric: He has a normal mood and affect. His behavior is normal.    BP 126/78   Pulse 86   Temp (!) 97 F (36.1 C) (Tympanic)   Ht 5\' 11"  (1.803 m)   Wt 206 lb 9.6 oz (93.7 kg)   SpO2 95%   BMI 28.81 kg/m  Wt Readings from Last 3 Encounters:  07/19/19 206 lb 9.6 oz (93.7 kg)  05/17/19 191 lb 8 oz (86.9 kg)  02/04/19 191 lb 6 oz (86.8 kg)     There are no preventive care reminders to display for this patient.  There are no preventive care reminders to display for this patient.  Lab Results  Component Value Date   TSH 2.31 02/04/2019   Lab Results  Component Value Date   WBC 5.0 02/04/2019   HGB 15.3 02/04/2019   HCT 44.2 02/04/2019   MCV 87.0 02/04/2019   PLT 216.0 02/04/2019   Lab Results  Component Value Date   NA 138 02/04/2019   K 4.1 02/04/2019   CO2 32 02/04/2019   GLUCOSE 91 02/04/2019   BUN 17 02/04/2019   CREATININE 0.94 02/04/2019   BILITOT 0.5 02/04/2019   ALKPHOS 57 02/04/2019   AST 16 02/04/2019   ALT 15 02/04/2019   PROT 7.0 02/04/2019   ALBUMIN 4.7 02/04/2019   CALCIUM 9.6 02/04/2019   GFR 97.01 02/04/2019   Lab Results  Component Value Date   CHOL 203 (H) 02/04/2019   Lab Results  Component Value Date   HDL 57.30 02/04/2019   Lab Results  Component Value Date   LDLCALC 130 (H) 02/04/2019   Lab Results  Component Value Date   TRIG 78.0 02/04/2019   Lab Results  Component Value Date   CHOLHDL 4 02/04/2019   No results found for: HGBA1C    Assessment & Plan:   Problem List Items Addressed This Visit      Other   Depression, major, single episode, severe (HCC) - Primary   Relevant Medications   buPROPion (WELLBUTRIN XL) 150 MG 24 hr tablet   FLUoxetine (PROZAC) 20 MG tablet      Meds ordered this encounter  Medications  . buPROPion (WELLBUTRIN XL) 150 MG 24 hr tablet    Sig: TAKE 1 TABLET(150 MG) BY MOUTH DAILY    Dispense:  90 tablet      Refill:  0  . FLUoxetine (PROZAC) 20 MG tablet    Sig: Take 1 tablet (20 mg total) by mouth daily.    Dispense:  90 tablet  Refill:  0    Follow-up: Return in about 3 months (around 10/16/2019), or Plan for E visit in May.Libby Maw, MD

## 2019-07-23 ENCOUNTER — Ambulatory Visit (INDEPENDENT_AMBULATORY_CARE_PROVIDER_SITE_OTHER): Payer: No Typology Code available for payment source | Admitting: Psychology

## 2019-07-23 DIAGNOSIS — F330 Major depressive disorder, recurrent, mild: Secondary | ICD-10-CM | POA: Diagnosis not present

## 2019-07-30 ENCOUNTER — Ambulatory Visit (INDEPENDENT_AMBULATORY_CARE_PROVIDER_SITE_OTHER): Payer: No Typology Code available for payment source | Admitting: Psychology

## 2019-07-30 DIAGNOSIS — F330 Major depressive disorder, recurrent, mild: Secondary | ICD-10-CM

## 2019-08-05 ENCOUNTER — Ambulatory Visit (INDEPENDENT_AMBULATORY_CARE_PROVIDER_SITE_OTHER): Payer: No Typology Code available for payment source | Admitting: Psychology

## 2019-08-05 DIAGNOSIS — F330 Major depressive disorder, recurrent, mild: Secondary | ICD-10-CM | POA: Diagnosis not present

## 2019-08-06 ENCOUNTER — Ambulatory Visit: Payer: No Typology Code available for payment source | Admitting: Psychology

## 2019-08-12 ENCOUNTER — Ambulatory Visit (INDEPENDENT_AMBULATORY_CARE_PROVIDER_SITE_OTHER): Payer: No Typology Code available for payment source | Admitting: Psychology

## 2019-08-12 DIAGNOSIS — F330 Major depressive disorder, recurrent, mild: Secondary | ICD-10-CM

## 2019-08-16 ENCOUNTER — Telehealth: Payer: Self-pay | Admitting: Family Medicine

## 2019-08-16 NOTE — Telephone Encounter (Signed)
Called patient to see if Rx was picked up from last month, patient states that he have not picked up since January. Advised pt to call his pharmacy to see if they still have it because Rx was sent in last month for 90 pills.

## 2019-08-16 NOTE — Telephone Encounter (Signed)
Patient is calling and requesting a refill for Fluoxetine sent to [facility] in [facility]. CB is [phone]

## 2019-08-19 ENCOUNTER — Ambulatory Visit (INDEPENDENT_AMBULATORY_CARE_PROVIDER_SITE_OTHER): Payer: No Typology Code available for payment source | Admitting: Psychology

## 2019-08-19 DIAGNOSIS — F330 Major depressive disorder, recurrent, mild: Secondary | ICD-10-CM

## 2019-08-26 ENCOUNTER — Ambulatory Visit (INDEPENDENT_AMBULATORY_CARE_PROVIDER_SITE_OTHER): Payer: No Typology Code available for payment source | Admitting: Psychology

## 2019-08-26 DIAGNOSIS — F330 Major depressive disorder, recurrent, mild: Secondary | ICD-10-CM

## 2019-09-02 ENCOUNTER — Ambulatory Visit (INDEPENDENT_AMBULATORY_CARE_PROVIDER_SITE_OTHER): Payer: No Typology Code available for payment source | Admitting: Psychology

## 2019-09-02 DIAGNOSIS — F330 Major depressive disorder, recurrent, mild: Secondary | ICD-10-CM

## 2019-09-09 ENCOUNTER — Ambulatory Visit: Payer: No Typology Code available for payment source | Admitting: Psychology

## 2019-09-15 ENCOUNTER — Other Ambulatory Visit: Payer: Self-pay | Admitting: Family Medicine

## 2019-09-15 DIAGNOSIS — F322 Major depressive disorder, single episode, severe without psychotic features: Secondary | ICD-10-CM

## 2019-09-15 NOTE — Telephone Encounter (Signed)
Last OV 08/04/19 Last fill 07/19/19  #90/0

## 2019-09-16 ENCOUNTER — Ambulatory Visit (INDEPENDENT_AMBULATORY_CARE_PROVIDER_SITE_OTHER): Payer: No Typology Code available for payment source | Admitting: Psychology

## 2019-09-16 DIAGNOSIS — F330 Major depressive disorder, recurrent, mild: Secondary | ICD-10-CM

## 2019-09-23 ENCOUNTER — Ambulatory Visit: Payer: No Typology Code available for payment source | Admitting: Psychology

## 2019-09-30 ENCOUNTER — Ambulatory Visit (INDEPENDENT_AMBULATORY_CARE_PROVIDER_SITE_OTHER): Payer: No Typology Code available for payment source | Admitting: Psychology

## 2019-09-30 DIAGNOSIS — F330 Major depressive disorder, recurrent, mild: Secondary | ICD-10-CM | POA: Diagnosis not present

## 2019-10-14 ENCOUNTER — Ambulatory Visit (INDEPENDENT_AMBULATORY_CARE_PROVIDER_SITE_OTHER): Payer: No Typology Code available for payment source | Admitting: Psychology

## 2019-10-14 DIAGNOSIS — F330 Major depressive disorder, recurrent, mild: Secondary | ICD-10-CM | POA: Diagnosis not present

## 2019-10-28 ENCOUNTER — Ambulatory Visit (INDEPENDENT_AMBULATORY_CARE_PROVIDER_SITE_OTHER): Payer: No Typology Code available for payment source | Admitting: Psychology

## 2019-10-28 DIAGNOSIS — F330 Major depressive disorder, recurrent, mild: Secondary | ICD-10-CM | POA: Diagnosis not present

## 2019-11-11 ENCOUNTER — Ambulatory Visit (INDEPENDENT_AMBULATORY_CARE_PROVIDER_SITE_OTHER): Payer: No Typology Code available for payment source | Admitting: Psychology

## 2019-11-11 DIAGNOSIS — F330 Major depressive disorder, recurrent, mild: Secondary | ICD-10-CM | POA: Diagnosis not present

## 2019-11-14 ENCOUNTER — Other Ambulatory Visit: Payer: Self-pay | Admitting: Family Medicine

## 2019-11-14 DIAGNOSIS — F322 Major depressive disorder, single episode, severe without psychotic features: Secondary | ICD-10-CM

## 2019-11-20 ENCOUNTER — Other Ambulatory Visit: Payer: Self-pay | Admitting: Family Medicine

## 2019-11-20 DIAGNOSIS — F322 Major depressive disorder, single episode, severe without psychotic features: Secondary | ICD-10-CM

## 2019-11-25 ENCOUNTER — Ambulatory Visit (INDEPENDENT_AMBULATORY_CARE_PROVIDER_SITE_OTHER): Payer: No Typology Code available for payment source | Admitting: Psychology

## 2019-11-25 DIAGNOSIS — F330 Major depressive disorder, recurrent, mild: Secondary | ICD-10-CM

## 2019-12-09 ENCOUNTER — Ambulatory Visit (INDEPENDENT_AMBULATORY_CARE_PROVIDER_SITE_OTHER): Payer: No Typology Code available for payment source | Admitting: Psychology

## 2019-12-09 DIAGNOSIS — F330 Major depressive disorder, recurrent, mild: Secondary | ICD-10-CM | POA: Diagnosis not present

## 2019-12-23 ENCOUNTER — Ambulatory Visit (INDEPENDENT_AMBULATORY_CARE_PROVIDER_SITE_OTHER): Payer: No Typology Code available for payment source | Admitting: Psychology

## 2019-12-23 DIAGNOSIS — F330 Major depressive disorder, recurrent, mild: Secondary | ICD-10-CM

## 2020-01-06 ENCOUNTER — Ambulatory Visit: Payer: No Typology Code available for payment source | Admitting: Psychology

## 2020-01-20 ENCOUNTER — Ambulatory Visit (INDEPENDENT_AMBULATORY_CARE_PROVIDER_SITE_OTHER): Payer: No Typology Code available for payment source | Admitting: Psychology

## 2020-01-20 DIAGNOSIS — F330 Major depressive disorder, recurrent, mild: Secondary | ICD-10-CM | POA: Diagnosis not present

## 2020-01-24 ENCOUNTER — Other Ambulatory Visit: Payer: Self-pay | Admitting: Family Medicine

## 2020-01-24 DIAGNOSIS — F322 Major depressive disorder, single episode, severe without psychotic features: Secondary | ICD-10-CM

## 2020-02-03 ENCOUNTER — Ambulatory Visit: Payer: No Typology Code available for payment source | Admitting: Psychology

## 2020-02-07 ENCOUNTER — Other Ambulatory Visit: Payer: Self-pay

## 2020-02-07 ENCOUNTER — Encounter: Payer: Self-pay | Admitting: Family Medicine

## 2020-02-07 ENCOUNTER — Ambulatory Visit (INDEPENDENT_AMBULATORY_CARE_PROVIDER_SITE_OTHER): Payer: No Typology Code available for payment source | Admitting: Family Medicine

## 2020-02-07 VITALS — BP 122/80 | HR 89 | Temp 97.8°F | Ht 71.0 in | Wt 197.2 lb

## 2020-02-07 DIAGNOSIS — Z0000 Encounter for general adult medical examination without abnormal findings: Secondary | ICD-10-CM

## 2020-02-07 DIAGNOSIS — F322 Major depressive disorder, single episode, severe without psychotic features: Secondary | ICD-10-CM | POA: Diagnosis not present

## 2020-02-07 LAB — CBC
HCT: 43.3 % (ref 39.0–52.0)
Hemoglobin: 14.7 g/dL (ref 13.0–17.0)
MCHC: 34.1 g/dL (ref 30.0–36.0)
MCV: 85.9 fl (ref 78.0–100.0)
Platelets: 269 10*3/uL (ref 150.0–400.0)
RBC: 5.04 Mil/uL (ref 4.22–5.81)
RDW: 12.9 % (ref 11.5–15.5)
WBC: 5.7 10*3/uL (ref 4.0–10.5)

## 2020-02-07 LAB — LIPID PANEL
Cholesterol: 188 mg/dL (ref 0–200)
HDL: 47 mg/dL (ref >39.00)
LDL Cholesterol: 122 mg/dL — ABNORMAL HIGH (ref 0–99)
NonHDL: 141.34
Total CHOL/HDL Ratio: 4
Triglycerides: 96 mg/dL (ref 0.0–149.0)
VLDL: 19.2 mg/dL (ref 0.0–40.0)

## 2020-02-07 LAB — COMPREHENSIVE METABOLIC PANEL
ALT: 13 U/L (ref 0–53)
AST: 16 U/L (ref 0–37)
Albumin: 4.5 g/dL (ref 3.5–5.2)
Alkaline Phosphatase: 64 U/L (ref 39–117)
BUN: 10 mg/dL (ref 6–23)
CO2: 29 mEq/L (ref 19–32)
Calcium: 9.5 mg/dL (ref 8.4–10.5)
Chloride: 102 mEq/L (ref 96–112)
Creatinine, Ser: 1.08 mg/dL (ref 0.40–1.50)
GFR: 82.02 mL/min (ref >60.00)
Glucose, Bld: 90 mg/dL (ref 70–99)
Potassium: 4.1 mEq/L (ref 3.5–5.1)
Sodium: 139 mEq/L (ref 135–145)
Total Bilirubin: 0.4 mg/dL (ref 0.2–1.2)
Total Protein: 7.3 g/dL (ref 6.0–8.3)

## 2020-02-07 LAB — LDL CHOLESTEROL, DIRECT: Direct LDL: 129 mg/dL

## 2020-02-07 MED ORDER — FLUOXETINE HCL (PMDD) 20 MG PO TABS: ORAL_TABLET | ORAL | 4 refills | Status: DC

## 2020-02-07 MED ORDER — BUPROPION HCL ER (XL) 300 MG PO TB24: 300.0000 mg | ORAL_TABLET | Freq: Every day | ORAL | 1 refills | Status: DC

## 2020-02-07 NOTE — Patient Instructions (Addendum)
Health Maintenance, Male Adopting a healthy lifestyle and getting preventive care are important in promoting health and wellness. Ask your health care provider about:  The right schedule for you to have regular tests and exams.  Things you can do on your own to prevent diseases and keep yourself healthy. What should I know about diet, weight, and exercise? Eat a healthy diet   Eat a diet that includes plenty of vegetables, fruits, low-fat dairy products, and lean protein.  Do not eat a lot of foods that are high in solid fats, added sugars, or sodium. Maintain a healthy weight Body mass index (BMI) is a measurement that can be used to identify possible weight problems. It estimates body fat based on height and weight. Your health care provider can help determine your BMI and help you achieve or maintain a healthy weight. Get regular exercise Get regular exercise. This is one of the most important things you can do for your health. Most adults should:  Exercise for at least 150 minutes each week. The exercise should increase your heart rate and make you sweat (moderate-intensity exercise).  Do strengthening exercises at least twice a week. This is in addition to the moderate-intensity exercise.  Spend less time sitting. Even light physical activity can be beneficial. Watch cholesterol and blood lipids Have your blood tested for lipids and cholesterol at 27 years of age, then have this test every 5 years. You may need to have your cholesterol levels checked more often if:  Your lipid or cholesterol levels are high.  You are older than 27 years of age.  You are at high risk for heart disease. What should I know about cancer screening? Many types of cancers can be detected early and may often be prevented. Depending on your health history and family history, you may need to have cancer screening at various ages. This may include screening for:  Colorectal cancer.  Prostate  cancer.  Skin cancer.  Lung cancer. What should I know about heart disease, diabetes, and high blood pressure? Blood pressure and heart disease  High blood pressure causes heart disease and increases the risk of stroke. This is more likely to develop in people who have high blood pressure readings, are of African descent, or are overweight.  Talk with your health care provider about your target blood pressure readings.  Have your blood pressure checked: ? Every 3-5 years if you are 27-39 years of age. ? Every year if you are 40 years old or older.  If you are between the ages of 65 and 75 and are a current or former smoker, ask your health care provider if you should have a one-time screening for abdominal aortic aneurysm (AAA). Diabetes Have regular diabetes screenings. This checks your fasting blood sugar level. Have the screening done:  Once every three years after age 27 if you are at a normal weight and have a low risk for diabetes.  More often and at a younger age if you are overweight or have a high risk for diabetes. What should I know about preventing infection? Hepatitis B If you have a higher risk for hepatitis B, you should be screened for this virus. Talk with your health care provider to find out if you are at risk for hepatitis B infection. Hepatitis C Blood testing is recommended for:  Everyone born from 1945 through 1965.  Anyone with known risk factors for hepatitis C. Sexually transmitted infections (STIs)  You should be screened each year   for STIs, including gonorrhea and chlamydia, if: ? You are sexually active and are younger than 27 years of age. ? You are older than 27 years of age and your health care provider tells you that you are at risk for this type of infection. ? Your sexual activity has changed since you were last screened, and you are at increased risk for chlamydia or gonorrhea. Ask your health care provider if you are at risk.  Ask your  health care provider about whether you are at high risk for HIV. Your health care provider may recommend a prescription medicine to help prevent HIV infection. If you choose to take medicine to prevent HIV, you should first get tested for HIV. You should then be tested every 3 months for as long as you are taking the medicine. Follow these instructions at home: Lifestyle  Do not use any products that contain nicotine or tobacco, such as cigarettes, e-cigarettes, and chewing tobacco. If you need help quitting, ask your health care provider.  Do not use street drugs.  Do not share needles.  Ask your health care provider for help if you need support or information about quitting drugs. Alcohol use  Do not drink alcohol if your health care provider tells you not to drink.  If you drink alcohol: ? Limit how much you have to 0-2 drinks a day. ? Be aware of how much alcohol is in your drink. In the [place] one drink equals one 12 oz bottle of beer (355 mL), one 5 oz glass of wine (148 mL), or one 1 oz glass of hard liquor (44 mL). General instructions  Schedule regular health, dental, and eye exams.  Stay current with your vaccines.  Tell your health care provider if: ? You often feel depressed. ? You have ever been abused or do not feel safe at home. Summary  Adopting a healthy lifestyle and getting preventive care are important in promoting health and wellness.  Follow your health care provider's instructions about healthy diet, exercising, and getting tested or screened for diseases.  Follow your health care provider's instructions on monitoring your cholesterol and blood pressure. This information is not intended to replace advice given to you by your health care provider. Make sure you discuss any questions you have with your health care provider. Document Revised: 05/20/2018 Document Reviewed: 05/20/2018 Elsevier Patient Education  2020 [place] 21-39 Years  Old, Male Preventive care refers to lifestyle choices and visits with your health care provider that can promote health and wellness. This includes:  A yearly physical exam. This is also called an annual well check.  Regular dental and eye exams.  Immunizations.  Screening for certain conditions.  Healthy lifestyle choices, such as eating a healthy diet, getting regular exercise, not using drugs or products that contain nicotine and tobacco, and limiting alcohol use. What can I expect for my preventive care visit? Physical exam Your health care provider will check:  Height and weight. These may be used to calculate body mass index (BMI), which is a measurement that tells if you are at a healthy weight.  Heart rate and blood pressure.  Your skin for abnormal spots. Counseling Your health care provider may ask you questions about:  Alcohol, tobacco, and drug use.  Emotional well-being.  Home and relationship well-being.  Sexual activity.  Eating habits.  Work and work Statistician. What immunizations do I need?  Influenza (flu) vaccine  This is recommended every year. Tetanus, diphtheria,  and pertussis (Tdap) vaccine  You may need a Td booster every 10 years. Varicella (chickenpox) vaccine  You may need this vaccine if you have not already been vaccinated. Human papillomavirus (HPV) vaccine  If recommended by your health care provider, you may need three doses over 6 months. Measles, mumps, and rubella (MMR) vaccine  You may need at least one dose of MMR. You may also need a second dose. Meningococcal conjugate (MenACWY) vaccine  One dose is recommended if you are 107-38 years old and a Market researcher living in a residence hall, or if you have one of several medical conditions. You may also need additional booster doses. Pneumococcal conjugate (PCV13) vaccine  You may need this if you have certain conditions and were not previously  vaccinated. Pneumococcal polysaccharide (PPSV23) vaccine  You may need one or two doses if you smoke cigarettes or if you have certain conditions. Hepatitis A vaccine  You may need this if you have certain conditions or if you travel or work in places where you may be exposed to hepatitis A. Hepatitis B vaccine  You may need this if you have certain conditions or if you travel or work in places where you may be exposed to hepatitis B. Haemophilus influenzae type b (Hib) vaccine  You may need this if you have certain risk factors. You may receive vaccines as individual doses or as more than one vaccine together in one shot (combination vaccines). Talk with your health care provider about the risks and benefits of combination vaccines. What tests do I need? Blood tests  Lipid and cholesterol levels. These may be checked every 5 years starting at age 5.  Hepatitis C test.  Hepatitis B test. Screening   Diabetes screening. This is done by checking your blood sugar (glucose) after you have not eaten for a while (fasting).  Sexually transmitted disease (STD) testing. Talk with your health care provider about your test results, treatment options, and if necessary, the need for more tests. Follow these instructions at home: Eating and drinking   Eat a diet that includes fresh fruits and vegetables, whole grains, lean protein, and low-fat dairy products.  Take vitamin and mineral supplements as recommended by your health care provider.  Do not drink alcohol if your health care provider tells you not to drink.  If you drink alcohol: ? Limit how much you have to 0-2 drinks a day. ? Be aware of how much alcohol is in your drink. In the [place] one drink equals one 12 oz bottle of beer (355 mL), one 5 oz glass of wine (148 mL), or one 1 oz glass of hard liquor (44 mL). Lifestyle  Take daily care of your teeth and gums.  Stay active. Exercise for at least 30 minutes on 5 or more days  each week.  Do not use any products that contain nicotine or tobacco, such as cigarettes, e-cigarettes, and chewing tobacco. If you need help quitting, ask your health care provider.  If you are sexually active, practice safe sex. Use a condom or other form of protection to prevent STIs (sexually transmitted infections). What's next?  Go to your health care provider once a year for a well check visit.  Ask your health care provider how often you should have your eyes and teeth checked.  Stay up to date on all vaccines. This information is not intended to replace advice given to you by your health care provider. Make sure you discuss any questions you  have with your health care provider. Document Revised: 05/21/2018 Document Reviewed: 05/21/2018 Elsevier Patient Education  2020 [place].  Preventing High Cholesterol Cholesterol is a white, waxy substance similar to fat that the human body needs to help build cells. The liver makes all the cholesterol that a person's body needs. Having high cholesterol (hypercholesterolemia) increases a person's risk for heart disease and stroke. Extra (excess) cholesterol comes from the food the person eats. High cholesterol can often be prevented with diet and lifestyle changes. If you already have high cholesterol, you can control it with diet and lifestyle changes and with medicine. How can high cholesterol affect me? If you have high cholesterol, deposits (plaques) may build up on the walls of your arteries. The arteries are the blood vessels that carry blood away from your heart. Plaques make the arteries narrower and stiffer. This can limit or block blood flow and cause blood clots to form. Blood clots:  Are tiny balls of cells that form in your blood.  Can move to the heart or brain, causing a heart attack or stroke. Plaques in arteries greatly increase your risk for heart attack and stroke.Making diet and lifestyle changes can reduce your risk  for these conditions that may threaten your life. What can increase my risk? This condition is more likely to develop in people who:  Eat foods that are high in saturated fat or cholesterol. Saturated fat is mostly found in: ? Foods that contain animal fat, such as red meat and some dairy products. ? Certain fatty foods made from plants, such as tropical oils.  Are overweight.  Are not getting enough exercise.  Have a family history of high cholesterol. What actions can I take to prevent this? Nutrition   Eat less saturated fat.  Avoid trans fats (partially hydrogenated oils). These are often found in margarine and in some baked goods, fried foods, and snacks bought in packages.  Avoid precooked or cured meat, such as sausages or meat loaves.  Avoid foods and drinks that have added sugars.  Eat more fruits, vegetables, and whole grains.  Choose healthy sources of protein, such as fish, poultry, lean cuts of red meat, beans, peas, lentils, and nuts.  Choose healthy sources of fat, such as: ? Nuts. ? Vegetable oils, especially olive oil. ? Fish that have healthy fats (omega-3 fatty acids), such as mackerel or salmon. The items listed above may not be a complete list of recommended foods and beverages. Contact a dietitian for more information. Lifestyle  Lose weight if you are overweight. Losing 5-10 lb (2.3-4.5 kg) can help prevent or control high cholesterol. It can also lower your risk for diabetes and high blood pressure. Ask your health care provider to help you with a diet and exercise plan to lose weight safely.  Do not use any products that contain nicotine or tobacco, such as cigarettes, e-cigarettes, and chewing tobacco. If you need help quitting, ask your health care provider.  Limit your alcohol intake. ? Do not drink alcohol if:  Your health care provider tells you not to drink.  You are pregnant, may be pregnant, or are planning to become pregnant. ? If you  drink alcohol:  Limit how much you use to:  0-1 drink a day for women.  0-2 drinks a day for men.  Be aware of how much alcohol is in your drink. In the [place] one drink equals one 12 oz bottle of beer (355 mL), one 5 oz glass of wine (148  mL), or one 1 oz glass of hard liquor (44 mL). Activity   Get enough exercise. Each week, do at least 150 minutes of exercise that takes a medium level of effort (moderate-intensity exercise). ? This is exercise that:  Makes your heart beat faster and makes you breathe harder than usual.  Allows you to still be able to talk. ? You could exercise in short sessions several times a day or longer sessions a few times a week. For example, on 5 days each week, you could walk fast or ride your bike 3 times a day for 10 minutes each time.  Do exercises as told by your health care provider. Medicines  In addition to diet and lifestyle changes, your health care provider may recommend medicines to help lower cholesterol. This may be a medicine to lower the amount of cholesterol your liver makes. You may need medicine if: ? Diet and lifestyle changes do not lower your cholesterol enough. ? You have high cholesterol and other risk factors for heart disease or stroke.  Take over-the-counter and prescription medicines only as told by your health care provider. General information  Manage your risk factors for high cholesterol. Talk with your health care provider about all your risk factors and how to lower your risk.  Manage other conditions that you have, such as diabetes or high blood pressure (hypertension).  Have blood tests to check your cholesterol levels at regular points in time as told by your health care provider.  Keep all follow-up visits as told by your health care provider. This is important. Where to find more information  American Heart Association: www.heart.org  National Heart, Lung, and Blood Institute:  https://wilson-eaton.com/ Summary  High cholesterol increases your risk for heart disease and stroke. By keeping your cholesterol level low, you can reduce your risk for these conditions.  High cholesterol can often be prevented with diet and lifestyle changes.  Work with your health care provider to manage your risk factors, and have your blood tested regularly. This information is not intended to replace advice given to you by your health care provider. Make sure you discuss any questions you have with your health care provider. Document Revised: 09/18/2018 Document Reviewed: 02/03/2016 Elsevier Patient Education  2020 [employer].

## 2020-02-07 NOTE — Addendum Note (Signed)
Addended by: Varney Biles on: 02/07/2020 11:30 AM   Modules accepted: Orders

## 2020-02-07 NOTE — Addendum Note (Signed)
Addended by: Varney Biles on: 02/07/2020 11:34 AM   Modules accepted: Orders

## 2020-02-07 NOTE — Progress Notes (Signed)
Established Patient Office Visit  Subjective:  Patient ID: Craig Andrade, male    DOB: 1990-12-05  Age: 29 y.o. MRN: [id number]  CC:  Chief Complaint  Patient presents with  . Annual Exam    CPE, no concerns. Patient fasting for labs.     HPI Craig Andrade presents for follow-up for depression and anxiety.  Things are well controlled with his current regimen.  His significant other has noticed recently moved in with him.  They are doing great.  Continues to exercise.  Has been eating healthier.  Will be starting back in to the office this coming Wednesday and is concerned about focus.  He had taken Ritalin in the past with good result.  History reviewed. No pertinent past medical history.  History reviewed. No pertinent surgical history.  History reviewed. No pertinent family history.  Social History   Socioeconomic History  . Marital status: Single    Spouse name: Not on file  . Number of children: Not on file  . Years of education: Not on file  . Highest education level: Not on file  Occupational History  . Not on file  Tobacco Use  . Smoking status: Never Smoker  . Smokeless tobacco: Never Used  Substance and Sexual Activity  . Alcohol use: Yes    Comment: drinks no more than 2 beers on occassion.   . Drug use: Never    Comment: No illicit drug use  . Sexual activity: Not on file  Other Topics Concern  . Not on file  Social History Narrative  . Not on file   Social Determinants of Health   Financial Resource Strain:   . Difficulty of Paying Living Expenses: Not on file  Food Insecurity:   . Worried About Programme researcher, broadcasting/film/video in the Last Year: Not on file  . Ran Out of Food in the Last Year: Not on file  Transportation Needs:   . Lack of Transportation (Medical): Not on file  . Lack of Transportation (Non-Medical): Not on file  Physical Activity:   . Days of Exercise per Week: Not on file  . Minutes of Exercise per Session: Not on file  Stress:   .  Feeling of Stress : Not on file  Social Connections:   . Frequency of Communication with Friends and Family: Not on file  . Frequency of Social Gatherings with Friends and Family: Not on file  . Attends Religious Services: Not on file  . Active Member of Clubs or Organizations: Not on file  . Attends Banker Meetings: Not on file  . Marital Status: Not on file  Intimate Partner Violence:   . Fear of Current or Ex-Partner: Not on file  . Emotionally Abused: Not on file  . Physically Abused: Not on file  . Sexually Abused: Not on file    Outpatient Medications Prior to Visit  Medication Sig Dispense Refill  . buPROPion (WELLBUTRIN XL) 150 MG 24 hr tablet TAKE 1 TABLET(150 MG) BY MOUTH DAILY 30 tablet 1  . Fluoxetine HCl, PMDD, 20 MG TABS TAKE 1 TABLET(20 MG) BY MOUTH DAILY 90 tablet 0   No facility-administered medications prior to visit.    No Known Allergies  ROS Review of Systems  Constitutional: Negative.   HENT: Negative.   Eyes: Negative for photophobia and visual disturbance.  Respiratory: Negative.   Cardiovascular: Negative.   Gastrointestinal: Negative.   Endocrine: Negative for polyphagia and polyuria.  Genitourinary: Negative.   Musculoskeletal:  Negative.   Neurological: Negative for tremors and speech difficulty.  Hematological: Does not bruise/bleed easily.   Depression screen [facility] 2/9 02/07/2020 07/19/2019 07/19/2019  Decreased Interest 0 1 0  Down, Depressed, Hopeless 0 0 1  PHQ - 2 Score 0 1 1  Altered sleeping - 0 -  Tired, decreased energy - 1 -  Change in appetite - 1 -  Feeling bad or failure about yourself  - 0 -  Trouble concentrating - 1 -  Moving slowly or fidgety/restless - 0 -  Suicidal thoughts - 0 -  PHQ-9 Score - 4 -  Difficult doing work/chores - - -      Objective:    Physical Exam Vitals and nursing note reviewed.  Constitutional:      General: He is not in acute distress.    Appearance: Normal appearance. He is normal  weight. He is not ill-appearing, toxic-appearing or diaphoretic.  HENT:     Head: Normocephalic and atraumatic.     Right Ear: Tympanic membrane, ear canal and external ear normal.     Left Ear: Tympanic membrane, ear canal and external ear normal.     Mouth/Throat:     Mouth: Mucous membranes are moist.     Pharynx: Oropharynx is clear. No oropharyngeal exudate or posterior oropharyngeal erythema.  Eyes:     General: No scleral icterus.       Right eye: No discharge.        Left eye: No discharge.     Conjunctiva/sclera: Conjunctivae normal.     Pupils: Pupils are equal, round, and reactive to light.  Neck:     Vascular: No carotid bruit.  Cardiovascular:     Rate and Rhythm: Regular rhythm. Tachycardia present.  Pulmonary:     Effort: Pulmonary effort is normal. No respiratory distress.     Breath sounds: Normal breath sounds. No stridor. No wheezing.  Abdominal:     General: Bowel sounds are normal. There is no distension.     Palpations: There is no mass.     Tenderness: There is no abdominal tenderness. There is no guarding or rebound.     Hernia: No hernia is present.  Musculoskeletal:     Cervical back: Normal range of motion. No rigidity or tenderness.     Right lower leg: No edema.     Left lower leg: No edema.  Lymphadenopathy:     Cervical: No cervical adenopathy.  Skin:    General: Skin is warm and dry.  Neurological:     Mental Status: He is alert and oriented to person, place, and time.  Psychiatric:        Mood and Affect: Mood normal.        Behavior: Behavior normal.     BP 122/80   Pulse 89   Temp 97.8 F (36.6 C) (Tympanic)   Ht 5\' 11"  (1.803 m)   Wt 197 lb 3.2 oz (89.4 kg)   SpO2 96%   BMI 27.50 kg/m  Wt Readings from Last 3 Encounters:  02/07/20 197 lb 3.2 oz (89.4 kg)  07/19/19 206 lb 9.6 oz (93.7 kg)  05/17/19 191 lb 8 oz (86.9 kg)     Health Maintenance Due  Topic Date Due  . Hepatitis C Screening  Never done  . INFLUENZA VACCINE   01/09/2020    There are no preventive care reminders to display for this patient.  Lab Results  Component Value Date   TSH 2.31 02/04/2019  Lab Results  Component Value Date   WBC 5.0 02/04/2019   HGB 15.3 02/04/2019   HCT 44.2 02/04/2019   MCV 87.0 02/04/2019   PLT 216.0 02/04/2019   Lab Results  Component Value Date   NA 138 02/04/2019   K 4.1 02/04/2019   CO2 32 02/04/2019   GLUCOSE 91 02/04/2019   BUN 17 02/04/2019   CREATININE 0.94 02/04/2019   BILITOT 0.5 02/04/2019   ALKPHOS 57 02/04/2019   AST 16 02/04/2019   ALT 15 02/04/2019   PROT 7.0 02/04/2019   ALBUMIN 4.7 02/04/2019   CALCIUM 9.6 02/04/2019   GFR 97.01 02/04/2019   Lab Results  Component Value Date   CHOL 203 (H) 02/04/2019   Lab Results  Component Value Date   HDL 57.30 02/04/2019   Lab Results  Component Value Date   LDLCALC 130 (H) 02/04/2019   Lab Results  Component Value Date   TRIG 78.0 02/04/2019   Lab Results  Component Value Date   CHOLHDL 4 02/04/2019   No results found for: HGBA1C    Assessment & Plan:   Problem List Items Addressed This Visit      Other   Depression, major, single episode, severe (HCC)   Relevant Medications   Fluoxetine HCl, PMDD, 20 MG TABS   buPROPion (WELLBUTRIN XL) 300 MG 24 hr tablet    Other Visit Diagnoses    Healthcare maintenance    -  Primary   Relevant Orders   CBC   Comprehensive metabolic panel   LDL cholesterol, direct   Lipid panel   Urinalysis, Routine w reflex microscopic      Meds ordered this encounter  Medications  . Fluoxetine HCl, PMDD, 20 MG TABS    Sig: TAKE 1 TABLET(20 MG) BY MOUTH DAILY    Dispense:  90 tablet    Refill:  4  . buPROPion (WELLBUTRIN XL) 300 MG 24 hr tablet    Sig: Take 1 tablet (300 mg total) by mouth daily.    Dispense:  90 tablet    Refill:  1    Follow-up: Return in about 6 months (around 08/07/2020), or if symptoms worsen or fail to improve.  Return fasting for above ordered blood work.   Have increased Wellbutrin to 300 mg to help with focus.  Given information on health maintenance disease prevention and preventing high cholesterol.  May return in 3 to 6 months to discuss perhaps Strattera otherwise we will continue higher dose of Wellbutrin to help with focus.    Mliss Sax, MD

## 2020-02-17 ENCOUNTER — Ambulatory Visit (INDEPENDENT_AMBULATORY_CARE_PROVIDER_SITE_OTHER): Payer: No Typology Code available for payment source | Admitting: Psychology

## 2020-02-17 ENCOUNTER — Other Ambulatory Visit: Payer: Self-pay | Admitting: Family Medicine

## 2020-02-17 DIAGNOSIS — F330 Major depressive disorder, recurrent, mild: Secondary | ICD-10-CM

## 2020-02-17 DIAGNOSIS — F322 Major depressive disorder, single episode, severe without psychotic features: Secondary | ICD-10-CM

## 2020-02-25 ENCOUNTER — Telehealth: Payer: Self-pay | Admitting: Family Medicine

## 2020-02-25 NOTE — Telephone Encounter (Signed)
Patient is calling and wanting to see if his Bupropion can be sent back to [facility] on [address], please advise. CB is [phone]

## 2020-02-28 NOTE — Telephone Encounter (Signed)
Rx sent on Sept 8th called patient to see if he would call his pharmacy to check on this

## 2020-03-01 ENCOUNTER — Other Ambulatory Visit: Payer: Self-pay

## 2020-03-01 ENCOUNTER — Telehealth: Payer: Self-pay | Admitting: Family Medicine

## 2020-03-01 DIAGNOSIS — F322 Major depressive disorder, single episode, severe without psychotic features: Secondary | ICD-10-CM

## 2020-03-01 NOTE — Telephone Encounter (Signed)
Patient needing refill on pending medication. Dosage was changed from Bupropion 150mg  to 300mg  at last visit on 02/07/20 patient switched pharmacy's from [facility] to [facility] new Rx for 300mg  was sent to [facility] and supposedly transferred to [facility] per pharmacist at [facility] they did not receive the new Rx for 300mg . Patient calling to see if new Rx for 300mg  could be sent to [facility]? Please advise.

## 2020-03-01 NOTE — Telephone Encounter (Signed)
Patient is calling and wanted to speak to someone regarding medication, please advise. CB is [phone]

## 2020-03-01 NOTE — Telephone Encounter (Signed)
Patient needed new Rx with new dosage sent to [facility]. Approval sent to PCP

## 2020-03-02 ENCOUNTER — Other Ambulatory Visit: Payer: Self-pay

## 2020-03-02 MED ORDER — BUPROPION HCL ER (XL) 300 MG PO TB24: 300.0000 mg | ORAL_TABLET | Freq: Every day | ORAL | 1 refills | Status: DC

## 2020-03-02 NOTE — Telephone Encounter (Signed)
Okay 

## 2020-03-16 ENCOUNTER — Ambulatory Visit (INDEPENDENT_AMBULATORY_CARE_PROVIDER_SITE_OTHER): Payer: No Typology Code available for payment source | Admitting: Psychology

## 2020-03-16 DIAGNOSIS — F330 Major depressive disorder, recurrent, mild: Secondary | ICD-10-CM

## 2020-03-30 ENCOUNTER — Ambulatory Visit (INDEPENDENT_AMBULATORY_CARE_PROVIDER_SITE_OTHER): Payer: No Typology Code available for payment source | Admitting: Psychology

## 2020-03-30 DIAGNOSIS — F330 Major depressive disorder, recurrent, mild: Secondary | ICD-10-CM

## 2020-04-13 ENCOUNTER — Ambulatory Visit: Payer: No Typology Code available for payment source | Admitting: Psychology

## 2020-09-13 ENCOUNTER — Encounter: Payer: Self-pay | Admitting: Family Medicine

## 2020-10-04 ENCOUNTER — Other Ambulatory Visit: Payer: Self-pay

## 2020-10-05 ENCOUNTER — Encounter: Payer: Self-pay | Admitting: Family Medicine

## 2020-10-05 ENCOUNTER — Telehealth (INDEPENDENT_AMBULATORY_CARE_PROVIDER_SITE_OTHER): Payer: No Typology Code available for payment source | Admitting: Family Medicine

## 2020-10-05 VITALS — Ht 71.0 in | Wt 205.0 lb

## 2020-10-05 DIAGNOSIS — F322 Major depressive disorder, single episode, severe without psychotic features: Secondary | ICD-10-CM

## 2020-10-05 DIAGNOSIS — R41840 Attention and concentration deficit: Secondary | ICD-10-CM | POA: Insufficient documentation

## 2020-10-05 MED ORDER — VENLAFAXINE HCL ER 75 MG PO CP24: ORAL_CAPSULE | ORAL | 1 refills | Status: DC

## 2020-10-05 NOTE — Patient Instructions (Signed)
Venlafaxine Extended-Release Capsules What is this medicine? VENLAFAXINE(VEN la fax een) is used to treat depression, anxiety and panic disorder. This medicine may be used for other purposes; ask your health care provider or pharmacist if you have questions. COMMON BRAND NAME(S): Effexor XR What should I tell my health care provider before I take this medicine? They need to know if you have any of these conditions:  bleeding disorders  glaucoma  heart disease  high blood pressure  high cholesterol  kidney disease  liver disease  low levels of sodium in the blood  mania or bipolar disorder  seizures  suicidal thoughts, plans, or attempt; a previous suicide attempt by you or a family  take medicines that treat or prevent blood clots  thyroid disease  an unusual or allergic reaction to venlafaxine, desvenlafaxine, other medicines, foods, dyes, or preservatives  pregnant or trying to get pregnant  breast-feeding How should I use this medicine? Take this medicine by mouth with a full glass of water. Follow the directions on the prescription label. Do not cut, crush, or chew this medicine. Take it with food. If needed, the capsule may be carefully opened and the entire contents sprinkled on a spoonful of cool applesauce. Swallow the applesauce/pellet mixture right away without chewing and follow with a glass of water to ensure complete swallowing of the pellets. Try to take your medicine at about the same time each day. Do not take your medicine more often than directed. Do not stop taking this medicine suddenly except upon the advice of your doctor. Stopping this medicine too quickly may cause serious side effects or your condition may worsen. A special MedGuide will be given to you by the pharmacist with each prescription and refill. Be sure to read this information carefully each time. Talk to your pediatrician regarding the use of this medicine in children. Special care may be  needed. Overdosage: If you think you have taken too much of this medicine contact a poison control center or emergency room at once. NOTE: This medicine is only for you. Do not share this medicine with others. What if I miss a dose? If you miss a dose, take it as soon as you can. If it is almost time for your next dose, take only that dose. Do not take double or extra doses. What may interact with this medicine? Do not take this medicine with any of the following medications:  certain medicines for fungal infections like fluconazole, itraconazole, ketoconazole, posaconazole, voriconazole  cisapride  desvenlafaxine  dronedarone  duloxetine  levomilnacipran  linezolid  MAOIs like Carbex, Eldepryl, Marplan, Nardil, and Parnate  methylene blue (injected into a vein)  milnacipran  pimozide  thioridazine This medicine may also interact with the following medications:  amphetamines  aspirin and aspirin-like medicines  certain medicines for depression, anxiety, or psychotic disturbances  certain medicines for migraine headaches like almotriptan, eletriptan, frovatriptan, naratriptan, rizatriptan, sumatriptan, zolmitriptan  certain medicines for sleep  certain medicines that treat or prevent blood clots like dalteparin, enoxaparin, warfarin  cimetidine  clozapine  diuretics  fentanyl  furazolidone  indinavir  isoniazid  lithium  metoprolol  NSAIDS, medicines for pain and inflammation, like ibuprofen or naproxen  other medicines that prolong the QT interval (cause an abnormal heart rhythm) like dofetilide, ziprasidone  procarbazine  rasagiline  supplements like St. John's wort, kava kava, valerian  tramadol  tryptophan This list may not describe all possible interactions. Give your health care provider a list of all the medicines,   herbs, non-prescription drugs, or dietary supplements you use. Also tell them if you smoke, drink alcohol, or use illegal  drugs. Some items may interact with your medicine. What should I watch for while using this medicine? Tell your doctor if your symptoms do not get better or if they get worse. Visit your doctor or health care professional for regular checks on your progress. Because it may take several weeks to see the full effects of this medicine, it is important to continue your treatment as prescribed by your doctor. Patients and their families should watch out for new or worsening thoughts of suicide or depression. Also watch out for sudden changes in feelings such as feeling anxious, agitated, panicky, irritable, hostile, aggressive, impulsive, severely restless, overly excited and hyperactive, or not being able to sleep. If this happens, especially at the beginning of treatment or after a change in dose, call your health care professional. This medicine can cause an increase in blood pressure. Check with your doctor for instructions on monitoring your blood pressure while taking this medicine. You may get drowsy or dizzy. Do not drive, use machinery, or do anything that needs mental alertness until you know how this medicine affects you. Do not stand or sit up quickly, especially if you are an older patient. This reduces the risk of dizzy or fainting spells. Alcohol may interfere with the effect of this medicine. Avoid alcoholic drinks. Your mouth may get dry. Chewing sugarless gum, sucking hard candy and drinking plenty of water will help. Contact your doctor if the problem does not go away or is severe. What side effects may I notice from receiving this medicine? Side effects that you should report to your doctor or health care professional as soon as possible:  allergic reactions like skin rash, itching or hives, swelling of the face, lips, or tongue  anxious  breathing problems  confusion  changes in vision  chest pain  confusion  elevated mood, decreased need for sleep, racing thoughts, impulsive  behavior  eye pain  fast, irregular heartbeat  feeling faint or lightheaded, falls  feeling agitated, angry, or irritable  hallucination, loss of contact with reality  high blood pressure  loss of balance or coordination  palpitations  redness, blistering, peeling or loosening of the skin, including inside the mouth  restlessness, pacing, inability to keep still  seizures  stiff muscles  suicidal thoughts or other mood changes  trouble passing urine or change in the amount of urine  trouble sleeping  unusual bleeding or bruising  unusually weak or tired  vomiting Side effects that usually do not require medical attention (report to your doctor or health care professional if they continue or are bothersome):  change in sex drive or performance  change in appetite or weight  constipation  dizziness  dry mouth  headache  increased sweating  nausea  tired This list may not describe all possible side effects. Call your doctor for medical advice about side effects. You may report side effects to FDA at 1-800-FDA-1088. Where should I keep my medicine? Keep out of the reach of children. Store at a controlled temperature between 20 and 25 degrees C (68 degrees and 77 degrees F), in a dry place. Throw away any unused medicine after the expiration date. NOTE: This sheet is a summary. It may not cover all possible information. If you have questions about this medicine, talk to your doctor, pharmacist, or health care provider.  2021 Elsevier/Gold Standard (2020-04-17 14:56:43)  

## 2020-10-05 NOTE — Progress Notes (Signed)
Established Patient Office Visit  Subjective:  Patient ID: Craig Andrade, male    DOB: 26-Jun-1990  Age: 30 y.o. MRN: [id number]  CC:  Chief Complaint  Patient presents with  . Follow-up    Follow up on medications, patient states that he stopped taking meds in December 2021    HPI [name] presents for follow-up of major depression with anxiety.  Things had improved for him and he stopped taking his medicines this past Christmas.  Symptoms have gradually been returning.  He tells of problems with anxiety, some difficulty with sleeping and focus.  He has a history of attention deficit disorder that had been diagnosed back in grade school.  He had taken Ritalin in the past with good result.  History reviewed. No pertinent past medical history.  History reviewed. No pertinent surgical history.  History reviewed. No pertinent family history.  Social History   Socioeconomic History  . Marital status: Single    Spouse name: Not on file  . Number of children: Not on file  . Years of education: Not on file  . Highest education level: Not on file  Occupational History  . Not on file  Tobacco Use  . Smoking status: Never Smoker  . Smokeless tobacco: Never Used  Substance and Sexual Activity  . Alcohol use: Yes    Comment: drinks no more than 2 beers on occassion.   . Drug use: Never    Comment: No illicit drug use  . Sexual activity: Not on file  Other Topics Concern  . Not on file  Social History Narrative  . Not on file   Social Determinants of Health   Financial Resource Strain: Not on file  Food Insecurity: Not on file  Transportation Needs: Not on file  Physical Activity: Not on file  Stress: Not on file  Social Connections: Not on file  Intimate Partner Violence: Not on file    Outpatient Medications Prior to Visit  Medication Sig Dispense Refill  . buPROPion (WELLBUTRIN XL) 300 MG 24 hr tablet Take 1 tablet (300 mg total) by mouth daily. (Patient  not taking: Reported on 10/05/2020) 90 tablet 1  . Fluoxetine HCl, PMDD, 20 MG TABS TAKE 1 TABLET(20 MG) BY MOUTH DAILY (Patient not taking: Reported on 10/05/2020) 90 tablet 4   No facility-administered medications prior to visit.    No Known Allergies  ROS Review of Systems  Constitutional: Negative.   HENT: Negative.   Eyes: Negative for photophobia and visual disturbance.  Respiratory: Negative.   Cardiovascular: Negative.   Gastrointestinal: Negative.   Psychiatric/Behavioral: Positive for decreased concentration and dysphoric mood. The patient is nervous/anxious.    Depression screen [facility] 2/9 10/05/2020 02/07/2020 02/07/2020  Decreased Interest 1 0 0  Down, Depressed, Hopeless 1 0 0  PHQ - 2 Score 2 0 0  Altered sleeping 2 1 -  Tired, decreased energy 2 0 -  Change in appetite 1 0 -  Feeling bad or failure about yourself  3 0 -  Trouble concentrating 3 2 -  Moving slowly or fidgety/restless 1 0 -  Suicidal thoughts 0 0 -  PHQ-9 Score 14 3 -  Difficult doing work/chores Somewhat difficult Somewhat difficult -      Objective:    Physical Exam Vitals and nursing note reviewed.  Constitutional:      Appearance: Normal appearance.  HENT:     Head: Normocephalic and atraumatic.  Eyes:     Conjunctiva/sclera: Conjunctivae normal.  Pulmonary:     Effort: Pulmonary effort is normal.  Skin:    General: Skin is warm and dry.  Neurological:     Mental Status: He is alert and oriented to person, place, and time.  Psychiatric:        Mood and Affect: Mood normal.        Behavior: Behavior normal.     Ht 5\' 11"  (1.803 m)   Wt 205 lb (93 kg)   BMI 28.59 kg/m  Wt Readings from Last 3 Encounters:  10/05/20 205 lb (93 kg)  02/07/20 197 lb 3.2 oz (89.4 kg)  07/19/19 206 lb 9.6 oz (93.7 kg)     Health Maintenance Due  Topic Date Due  . Hepatitis C Screening  Never done    There are no preventive care reminders to display for this patient.  Lab Results  Component  Value Date   TSH 2.31 02/04/2019   Lab Results  Component Value Date   WBC 5.7 02/07/2020   HGB 14.7 02/07/2020   HCT 43.3 02/07/2020   MCV 85.9 02/07/2020   PLT 269.0 02/07/2020   Lab Results  Component Value Date   NA 139 02/07/2020   K 4.1 02/07/2020   CO2 29 02/07/2020   GLUCOSE 90 02/07/2020   BUN 10 02/07/2020   CREATININE 1.08 02/07/2020   BILITOT 0.4 02/07/2020   ALKPHOS 64 02/07/2020   AST 16 02/07/2020   ALT 13 02/07/2020   PROT 7.3 02/07/2020   ALBUMIN 4.5 02/07/2020   CALCIUM 9.5 02/07/2020   GFR 82.02 02/07/2020   Lab Results  Component Value Date   CHOL 188 02/07/2020   Lab Results  Component Value Date   HDL 47.00 02/07/2020   Lab Results  Component Value Date   LDLCALC 122 (H) 02/07/2020   Lab Results  Component Value Date   TRIG 96.0 02/07/2020   Lab Results  Component Value Date   CHOLHDL 4 02/07/2020   No results found for: HGBA1C    Assessment & Plan:   Problem List Items Addressed This Visit      Other   Depression, major, single episode, severe (HCC) - Primary   Relevant Medications   venlafaxine XR (EFFEXOR XR) 75 MG 24 hr capsule   Attention deficit   Relevant Medications   venlafaxine XR (EFFEXOR XR) 75 MG 24 hr capsule      Meds ordered this encounter  Medications  . venlafaxine XR (EFFEXOR XR) 75 MG 24 hr capsule    Sig: Take one each morning for a week and then take 2 each morning.    Dispense:  60 capsule    Refill:  1    Follow-up: Return in about 8 weeks (around 11/30/2020).  PHQ-9 does reveal depression with anxiety.  He feels that his mood is affected by his inability to concentrate at work.  He agreed to try Effexor and revisit the possible need for stimulant in 6 to 7 weeks.  Information about Effexor was given.  12/02/2020, MD   Virtual Visit via Video Note  I connected with Mliss Sax on 10/05/20 at 11:00 AM EDT by a video enabled telemedicine application and verified that I am  speaking with the correct person using two identifiers.  Location: Patient: home Provider: work   I discussed the limitations of evaluation and management by telemedicine and the availability of in person appointments. The patient expressed understanding and agreed to proceed.  History of Present Illness:  Observations/Objective:   Assessment and Plan:   Follow Up Instructions:    I discussed the assessment and treatment plan with the patient. The patient was provided an opportunity to ask questions and all were answered. The patient agreed with the plan and demonstrated an understanding of the instructions.   The patient was advised to call back or seek an in-person evaluation if the symptoms worsen or if the condition fails to improve as anticipated.  I provided 25 minutes of non-face-to-face time during this encounter.   Mliss Sax, MD

## 2020-11-03 ENCOUNTER — Other Ambulatory Visit: Payer: Self-pay

## 2020-11-20 ENCOUNTER — Ambulatory Visit: Payer: No Typology Code available for payment source | Admitting: Family Medicine

## 2020-11-20 ENCOUNTER — Encounter: Payer: Self-pay | Admitting: Family Medicine

## 2020-11-20 ENCOUNTER — Other Ambulatory Visit: Payer: Self-pay

## 2020-11-20 VITALS — BP 120/82 | HR 87 | Temp 97.5°F | Ht 71.0 in | Wt 199.4 lb

## 2020-11-20 DIAGNOSIS — F418 Other specified anxiety disorders: Secondary | ICD-10-CM

## 2020-11-20 MED ORDER — VENLAFAXINE HCL ER 150 MG PO CP24: 150.0000 mg | ORAL_CAPSULE | Freq: Every day | ORAL | 1 refills | Status: DC

## 2020-11-20 NOTE — Progress Notes (Signed)
Established Patient Office Visit  Subjective:  Patient ID: Craig Andrade, male    DOB: [birth year]  Age: 30 y.o. MRN: [id number]  CC:  Chief Complaint  Patient presents with   Follow-up    Follow up on medications. No concerns.     HPI Craig Andrade presents for follow-up anxiety and depression with attention issues.  Reports a marvelous response to the Effexor.  His elevated his mood and taken away the anxiety spirals he had endured in the past.  Focus is better.  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  He is quite active physically.  History reviewed. No pertinent past medical history.  History reviewed. No pertinent surgical history.  History reviewed. No pertinent family history.  Social History   Socioeconomic History   Marital status: Single    Spouse name: Not on file   Number of children: Not on file   Years of education: Not on file   Highest education level: Not on file  Occupational History   Not on file  Tobacco Use   Smoking status: Never   Smokeless tobacco: Never  Substance and Sexual Activity   Alcohol use: Yes    Comment: drinks no more than 2 beers on occassion.    Drug use: Never    Comment: No illicit drug use   Sexual activity: Not on file  Other Topics Concern   Not on file  Social History Narrative   Not on file   Social Determinants of Health   Financial Resource Strain: Not on file  Food Insecurity: Not on file  Transportation Needs: Not on file  Physical Activity: Not on file  Stress: Not on file  Social Connections: Not on file  Intimate Partner Violence: Not on file    Outpatient Medications Prior to Visit  Medication Sig Dispense Refill   venlafaxine XR (EFFEXOR XR) 75 MG 24 hr capsule Take one each morning for a week and then take 2 each morning. 60 capsule 1   No facility-administered medications prior to visit.    No Known Allergies  ROS Review of Systems  Psychiatric/Behavioral:   Negative for behavioral problems, decreased concentration and dysphoric mood. The patient is not nervous/anxious.      Objective:    Physical Exam Vitals and nursing note reviewed.  HENT:     Head: Normocephalic and atraumatic.  Pulmonary:     Effort: Pulmonary effort is normal.  Neurological:     General: No focal deficit present.     Mental Status: He is oriented to person, place, and time.    BP 120/82   Pulse 87   Temp (!) 97.5 F (36.4 C) (Temporal)   Ht 5\' 11"  (1.803 m)   Wt 199 lb 6.4 oz (90.4 kg)   SpO2 96%   BMI 27.81 kg/m  Wt Readings from Last 3 Encounters:  11/20/20 199 lb 6.4 oz (90.4 kg)  10/05/20 205 lb (93 kg)  02/07/20 197 lb 3.2 oz (89.4 kg)     Health Maintenance Due  Topic Date Due   Hepatitis C Screening  Never done    There are no preventive care reminders to display for this patient.  Lab Results  Component Value Date   TSH 2.31 02/04/2019   Lab Results  Component Value Date   WBC 5.7 02/07/2020   HGB 14.7 02/07/2020   HCT 43.3 02/07/2020   MCV 85.9 02/07/2020   PLT 269.0 02/07/2020   Lab Results  Component Value Date   NA 139 02/07/2020   K 4.1 02/07/2020   CO2 29 02/07/2020   GLUCOSE 90 02/07/2020   BUN 10 02/07/2020   CREATININE 1.08 02/07/2020   BILITOT 0.4 02/07/2020   ALKPHOS 64 02/07/2020   AST 16 02/07/2020   ALT 13 02/07/2020   PROT 7.3 02/07/2020   ALBUMIN 4.5 02/07/2020   CALCIUM 9.5 02/07/2020   GFR 82.02 02/07/2020   Lab Results  Component Value Date   CHOL 188 02/07/2020   Lab Results  Component Value Date   HDL 47.00 02/07/2020   Lab Results  Component Value Date   LDLCALC 122 (H) 02/07/2020   Lab Results  Component Value Date   TRIG 96.0 02/07/2020   Lab Results  Component Value Date   CHOLHDL 4 02/07/2020   No results found for: HGBA1C    Assessment & Plan:   Problem List Items Addressed This Visit   None Visit Diagnoses     Depression with anxiety    -  Primary   Relevant  Medications   venlafaxine XR (EFFEXOR XR) 150 MG 24 hr capsule       Meds ordered this encounter  Medications   venlafaxine XR (EFFEXOR XR) 150 MG 24 hr capsule    Sig: Take 1 capsule (150 mg total) by mouth daily with breakfast.    Dispense:  90 capsule    Refill:  1    Follow-up: Return in about 6 months (around 05/22/2021).   So happy with his progress.  We will continue Effexor we will follow-up in 6 months or sooner as needed. Mliss Sax, MD

## 2020-11-30 ENCOUNTER — Telehealth: Payer: Self-pay

## 2020-11-30 NOTE — Telephone Encounter (Signed)
Pt calling to change current pharmacy.  He would like to switch to [facility].  I changed pharmacy in file.

## 2020-12-09 ENCOUNTER — Other Ambulatory Visit: Payer: Self-pay | Admitting: Family Medicine

## 2020-12-09 DIAGNOSIS — F322 Major depressive disorder, single episode, severe without psychotic features: Secondary | ICD-10-CM

## 2020-12-09 DIAGNOSIS — R41840 Attention and concentration deficit: Secondary | ICD-10-CM

## 2021-03-07 ENCOUNTER — Other Ambulatory Visit: Payer: Self-pay | Admitting: Family Medicine

## 2021-03-07 DIAGNOSIS — R41840 Attention and concentration deficit: Secondary | ICD-10-CM

## 2021-03-07 DIAGNOSIS — F322 Major depressive disorder, single episode, severe without psychotic features: Secondary | ICD-10-CM

## 2021-05-08 ENCOUNTER — Encounter: Payer: Self-pay | Admitting: Family Medicine

## 2021-05-08 ENCOUNTER — Other Ambulatory Visit: Payer: Self-pay

## 2021-05-08 ENCOUNTER — Ambulatory Visit (HOSPITAL_COMMUNITY): Admission: RE | Admit: 2021-05-08 | Payer: No Typology Code available for payment source | Source: Ambulatory Visit

## 2021-05-08 ENCOUNTER — Ambulatory Visit (INDEPENDENT_AMBULATORY_CARE_PROVIDER_SITE_OTHER): Payer: No Typology Code available for payment source | Admitting: Family Medicine

## 2021-05-08 ENCOUNTER — Ambulatory Visit (INDEPENDENT_AMBULATORY_CARE_PROVIDER_SITE_OTHER): Payer: No Typology Code available for payment source

## 2021-05-08 VITALS — BP 134/70 | HR 95 | Temp 97.9°F | Ht 71.0 in | Wt 212.0 lb

## 2021-05-08 DIAGNOSIS — S338XXA Sprain of other parts of lumbar spine and pelvis, initial encounter: Secondary | ICD-10-CM

## 2021-05-08 DIAGNOSIS — Z0001 Encounter for general adult medical examination with abnormal findings: Secondary | ICD-10-CM

## 2021-05-08 DIAGNOSIS — Z23 Encounter for immunization: Secondary | ICD-10-CM | POA: Diagnosis not present

## 2021-05-08 DIAGNOSIS — R41840 Attention and concentration deficit: Secondary | ICD-10-CM

## 2021-05-08 DIAGNOSIS — F418 Other specified anxiety disorders: Secondary | ICD-10-CM | POA: Diagnosis not present

## 2021-05-08 DIAGNOSIS — Z0000 Encounter for general adult medical examination without abnormal findings: Secondary | ICD-10-CM

## 2021-05-08 NOTE — Progress Notes (Signed)
Established Patient Office Visit  Subjective:  Patient ID: Craig Andrade, male    DOB: 1990-08-20  Age: 30 y.o. MRN: [id number]  CC:  Chief Complaint  Patient presents with   Annual Exam    CPE, patient would like referral to psychiatrist to start back on medications. Lots of pain at tailbone would like this checked.     HPI Craig Andrade presents for a physical exam, follow-up for anxiety and depression, attention deficit and pain in his tailbone area.  Denies injury to the tailbone area area.  He does rockclimbing on the wall with padded flooring.  He has fallen and landed on his rear end several times.  Denied any acute pain after these falls.  There is no movement of the pain out of this area.  No problems with his bowel or bladder function.  No weakness.  Anxiety with depression have been well controlled with his current dose of Effexor.  It is also helped his attention deficit until recently.  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  He would like to see a psychiatrist.  Recent COVID infection.  His symptoms resolved 9 days ago.  Denies any forgetfulness or lingering brain fog  No past medical history on file.  No past surgical history on file.  Family History  Problem Relation Age of Onset   Healthy Mother    Healthy Father     Social History   Socioeconomic History   Marital status: Single    Spouse name: Not on file   Number of children: Not on file   Years of education: Not on file   Highest education level: Not on file  Occupational History   Not on file  Tobacco Use   Smoking status: Never   Smokeless tobacco: Never  Vaping Use   Vaping Use: Never used  Substance and Sexual Activity   Alcohol use: Yes    Alcohol/week: 1.0 standard drink    Types: 1 Glasses of wine per week    Comment: drinks no more than 2 beers on occassion.    Drug use: Never    Comment: No illicit drug use   Sexual activity: Not Currently  Other Topics Concern    Not on file  Social History Narrative   Not on file   Social Determinants of Health   Financial Resource Strain: Not on file  Food Insecurity: Not on file  Transportation Needs: Not on file  Physical Activity: Not on file  Stress: Not on file  Social Connections: Not on file  Intimate Partner Violence: Not on file    Outpatient Medications Prior to Visit  Medication Sig Dispense Refill   venlafaxine XR (EFFEXOR-XR) 150 MG 24 hr capsule TAKE 1 CAPSULE(150 MG) BY MOUTH DAILY WITH BREAKFAST 90 capsule 0   No facility-administered medications prior to visit.    No Known Allergies  ROS Review of Systems  Constitutional:  Negative for diaphoresis, fatigue, fever and unexpected weight change.  HENT: Negative.    Eyes:  Negative for photophobia and visual disturbance.  Respiratory:  Negative for chest tightness, shortness of breath and wheezing.   Cardiovascular: Negative.   Gastrointestinal: Negative.   Endocrine: Negative for polyphagia and polyuria.  Genitourinary: Negative.   Musculoskeletal:  Positive for back pain.  Neurological:  Negative for speech difficulty and weakness.  Psychiatric/Behavioral:  Positive for decreased concentration.      Objective:    Physical Exam Vitals and nursing note reviewed.  Constitutional:  General: He is not in acute distress.    Appearance: Normal appearance. He is not ill-appearing, toxic-appearing or diaphoretic.  HENT:     Head: Normocephalic and atraumatic.     Right Ear: Tympanic membrane, ear canal and external ear normal.     Left Ear: Tympanic membrane, ear canal and external ear normal.     Mouth/Throat:     Mouth: Mucous membranes are moist.     Pharynx: Oropharynx is clear. No oropharyngeal exudate or posterior oropharyngeal erythema.  Eyes:     General: No scleral icterus.       Right eye: No discharge.        Left eye: No discharge.     Extraocular Movements: Extraocular movements intact.     Conjunctiva/sclera:  Conjunctivae normal.     Pupils: Pupils are equal, round, and reactive to light.  Cardiovascular:     Rate and Rhythm: Normal rate and regular rhythm.  Pulmonary:     Effort: Pulmonary effort is normal. No respiratory distress.     Breath sounds: Normal breath sounds. No wheezing or rales.  Abdominal:     General: Abdomen is flat. Bowel sounds are normal. There is no distension.     Palpations: Abdomen is soft. There is no mass.     Tenderness: There is no abdominal tenderness. There is no guarding or rebound.     Hernia: No hernia is present. There is no hernia in the left inguinal area or right inguinal area.  Genitourinary:    Penis: Circumcised. No hypospadias, erythema, tenderness, discharge, swelling or lesions.      Testes:        Right: Mass, tenderness or swelling not present. Right testis is descended.        Left: Mass, tenderness or swelling not present. Left testis is descended.     Epididymis:     Right: Not inflamed or enlarged.     Left: Not inflamed or enlarged.  Musculoskeletal:     Cervical back: No rigidity or tenderness.       Back:  Lymphadenopathy:     Cervical: No cervical adenopathy.     Lower Body: No right inguinal adenopathy. No left inguinal adenopathy.  Skin:    General: Skin is warm and dry.  Neurological:     Mental Status: He is alert and oriented to person, place, and time.  Psychiatric:        Mood and Affect: Mood normal.        Behavior: Behavior normal.    BP 134/70 (BP Location: Right Arm, Patient Position: Sitting, Cuff Size: Large)   Pulse 95   Temp 97.9 F (36.6 C) (Temporal)   Ht 5\' 11"  (1.803 m) Comment: 5'11' 1/2  Wt 212 lb (96.2 kg)   SpO2 97%   BMI 29.57 kg/m  Wt Readings from Last 3 Encounters:  05/08/21 212 lb (96.2 kg)  11/20/20 199 lb 6.4 oz (90.4 kg)  10/05/20 205 lb (93 kg)     Health Maintenance Due  Topic Date Due   Hepatitis C Screening  Never done   INFLUENZA VACCINE  01/08/2021    There are no  preventive care reminders to display for this patient.  Lab Results  Component Value Date   TSH 2.31 02/04/2019   Lab Results  Component Value Date   WBC 5.7 02/07/2020   HGB 14.7 02/07/2020   HCT 43.3 02/07/2020   MCV 85.9 02/07/2020   PLT 269.0 02/07/2020   Lab  Results  Component Value Date   NA 139 02/07/2020   K 4.1 02/07/2020   CO2 29 02/07/2020   GLUCOSE 90 02/07/2020   BUN 10 02/07/2020   CREATININE 1.08 02/07/2020   BILITOT 0.4 02/07/2020   ALKPHOS 64 02/07/2020   AST 16 02/07/2020   ALT 13 02/07/2020   PROT 7.3 02/07/2020   ALBUMIN 4.5 02/07/2020   CALCIUM 9.5 02/07/2020   GFR 82.02 02/07/2020   Lab Results  Component Value Date   CHOL 188 02/07/2020   Lab Results  Component Value Date   HDL 47.00 02/07/2020   Lab Results  Component Value Date   LDLCALC 122 (H) 02/07/2020   Lab Results  Component Value Date   TRIG 96.0 02/07/2020   Lab Results  Component Value Date   CHOLHDL 4 02/07/2020   No results found for: HGBA1C    Assessment & Plan:   Problem List Items Addressed This Visit       Other   Attention deficit   Relevant Orders   Ambulatory referral to Psychiatry   Sprain of coccyx   Relevant Orders   DG Sacrum/Coccyx   Depression with anxiety - Primary   Relevant Orders   Ambulatory referral to Psychiatry   Other Visit Diagnoses     Healthcare maintenance       Relevant Orders   CBC   Comprehensive metabolic panel   Lipid panel   Urinalysis, Routine w reflex microscopic       No orders of the defined types were placed in this encounter.   Follow-up: Return in about 1 month (around 06/07/2021), or if symptoms worsen or fail to improve.  Suspect strain of coccyx.  It will take time.  We will check x-rays to confirm bony integrity.  Continue Effexor.  Will return fasting for above ordered blood work.  Referral to psychiatry.  Information was given on health maintenance and disease prevention.  Follow-up in 1 month if  tenderness in the coccyx area does not improve.  Libby Maw, MD

## 2021-05-16 ENCOUNTER — Other Ambulatory Visit: Payer: Self-pay

## 2021-05-16 ENCOUNTER — Other Ambulatory Visit (INDEPENDENT_AMBULATORY_CARE_PROVIDER_SITE_OTHER): Payer: No Typology Code available for payment source

## 2021-05-16 DIAGNOSIS — Z0000 Encounter for general adult medical examination without abnormal findings: Secondary | ICD-10-CM | POA: Diagnosis not present

## 2021-05-16 LAB — CBC
HCT: 42.4 % (ref 39.0–52.0)
Hemoglobin: 14.4 g/dL (ref 13.0–17.0)
MCHC: 34 g/dL (ref 30.0–36.0)
MCV: 85 fl (ref 78.0–100.0)
Platelets: 238 10*3/uL (ref 150.0–400.0)
RBC: 4.99 Mil/uL (ref 4.22–5.81)
RDW: 12.7 % (ref 11.5–15.5)
WBC: 4.7 10*3/uL (ref 4.0–10.5)

## 2021-05-16 LAB — URINALYSIS, ROUTINE W REFLEX MICROSCOPIC
Hgb urine dipstick: NEGATIVE
Ketones, ur: 15 — AB
Leukocytes,Ua: NEGATIVE
Nitrite: NEGATIVE
RBC / HPF: NONE SEEN (ref 0–?)
Specific Gravity, Urine: 1.025 (ref 1.000–1.030)
Total Protein, Urine: NEGATIVE
Urine Glucose: NEGATIVE
Urobilinogen, UA: 1 (ref 0.0–1.0)
pH: 6 (ref 5.0–8.0)

## 2021-05-16 LAB — COMPREHENSIVE METABOLIC PANEL
ALT: 17 U/L (ref 0–53)
AST: 17 U/L (ref 0–37)
Albumin: 4.4 g/dL (ref 3.5–5.2)
Alkaline Phosphatase: 68 U/L (ref 39–117)
BUN: 11 mg/dL (ref 6–23)
CO2: 27 mEq/L (ref 19–32)
Calcium: 9.3 mg/dL (ref 8.4–10.5)
Chloride: 105 mEq/L (ref 96–112)
Creatinine, Ser: 0.93 mg/dL (ref 0.40–1.50)
GFR: 111.93 mL/min (ref >60.00)
Glucose, Bld: 87 mg/dL (ref 70–99)
Potassium: 3.8 mEq/L (ref 3.5–5.1)
Sodium: 139 mEq/L (ref 135–145)
Total Bilirubin: 0.7 mg/dL (ref 0.2–1.2)
Total Protein: 7.1 g/dL (ref 6.0–8.3)

## 2021-05-16 LAB — LIPID PANEL
Cholesterol: 194 mg/dL (ref 0–200)
HDL: 49.8 mg/dL (ref >39.00)
LDL Cholesterol: 131 mg/dL — ABNORMAL HIGH (ref 0–99)
NonHDL: 144.38
Total CHOL/HDL Ratio: 4
Triglycerides: 65 mg/dL (ref 0.0–149.0)
VLDL: 13 mg/dL (ref 0.0–40.0)

## 2021-05-25 ENCOUNTER — Telehealth: Payer: Self-pay | Admitting: Family Medicine

## 2021-05-25 NOTE — Telephone Encounter (Signed)
Pt called and asked if his xray could please be uploaded to his mychart so he can see or if it cant he would like to pick up a copy of it before next Thursday. He asked for a call back either way at [phone]

## 2021-05-28 NOTE — Telephone Encounter (Signed)
CD printed off for patient who agrees to stop by to come pick up.

## 2021-05-30 NOTE — Telephone Encounter (Signed)
Pt is stating he only got one image on his x-ray and is wanting all the images. He would like to pick this up before the end of the day. Please advise at [phone].

## 2021-05-30 NOTE — Telephone Encounter (Signed)
Sent message to Craig Andrade to ask about this. Waiting on response.   Dm/cma

## 2021-05-30 NOTE — Telephone Encounter (Signed)
disc is at the front desk. I checked and all images were there. Pt was notified and he will pick up today

## 2021-07-17 ENCOUNTER — Encounter: Payer: Self-pay | Admitting: Psychiatry

## 2021-07-17 ENCOUNTER — Ambulatory Visit (INDEPENDENT_AMBULATORY_CARE_PROVIDER_SITE_OTHER): Payer: No Typology Code available for payment source | Admitting: Licensed Clinical Social Worker

## 2021-07-17 ENCOUNTER — Telehealth (INDEPENDENT_AMBULATORY_CARE_PROVIDER_SITE_OTHER): Payer: BC Managed Care – PPO | Admitting: Psychiatry

## 2021-07-17 ENCOUNTER — Other Ambulatory Visit: Payer: Self-pay

## 2021-07-17 DIAGNOSIS — F418 Other specified anxiety disorders: Secondary | ICD-10-CM

## 2021-07-17 DIAGNOSIS — F3341 Major depressive disorder, recurrent, in partial remission: Secondary | ICD-10-CM | POA: Diagnosis not present

## 2021-07-17 DIAGNOSIS — F909 Attention-deficit hyperactivity disorder, unspecified type: Secondary | ICD-10-CM | POA: Diagnosis not present

## 2021-07-17 DIAGNOSIS — F902 Attention-deficit hyperactivity disorder, combined type: Secondary | ICD-10-CM

## 2021-07-17 MED ORDER — VENLAFAXINE HCL ER 150 MG PO CP24: 150.0000 mg | ORAL_CAPSULE | Freq: Every day | ORAL | 0 refills | Status: DC

## 2021-07-17 NOTE — Patient Instructions (Addendum)
Obtain urine drug screening (UDS) Plan to start concerta 18 mg daily after obtaining UDS Continue venlafaxine 150 mg daily  Next appointment: 3.6 at 4 PM

## 2021-07-17 NOTE — Progress Notes (Signed)
Virtual Visit via Video Note  I connected with Craig Andrade on 07/17/21 at  4:00 PM EST by a video enabled telemedicine application and verified that I am speaking with the correct person using two identifiers.  Location: Patient: home Provider: office Persons participated in the visit- patient, provider    I discussed the limitations of evaluation and management by telemedicine and the availability of in person appointments. The patient expressed understanding and agreed to proceed.    I discussed the assessment and treatment plan with the patient. The patient was provided an opportunity to ask questions and all were answered. The patient agreed with the plan and demonstrated an understanding of the instructions.   The patient was advised to call back or seek an in-person evaluation if the symptoms worsen or if the condition fails to improve as anticipated.  I provided 40 minutes of non-face-to-face time during this encounter.   Neysa Hotter, MD     Psychiatric Initial Adult Assessment   Patient Identification: Craig Andrade MRN:  [id number] Date of Evaluation:  07/17/2021 Referral Source: Mliss Sax,*  Chief Complaint:   Chief Complaint   Establish Care    Visit Diagnosis:    ICD-10-CM   1. Attention deficit hyperactivity disorder (ADHD), unspecified ADHD type  F90.9 Drug Screen, Urine    2. MDD (major depressive disorder), recurrent, in partial remission (HCC)  F33.41 venlafaxine XR (EFFEXOR-XR) 150 MG 24 hr capsule      History of Present Illness:   Craig Andrade is a 31 y.o. year old male with a history of depression, anxiety, who is referred for inattention.   He states that he made this appointment due to difficulty in concentration.  He states that he was diagnosed as ADD as a kid, and has tried on several medication.  Although he was taking this up to college, he tried to discontinue this medication as he did not like to take medication.   However, he is now more open to take medication if that is needed as he realized that it is not logical nor healthy for him, if he could do so much better with starting the treatment.  He reports good relationship with his parents, who he speaks few times per week in [place].  They are very supportive.  He thinks he has been adjusting well to moving to [place] 5 years ago for his work. He enjoys meeting with his friends, and doing rock climbing.  Although he has issues at work due to his inattention, he does not think it has affected his personal life as much.  He has been wandering if he truly has ADHD due to the report of over diagnosis in the past. He is willing to get evaluation of ADHD.   Inattention-he has difficulty with focus, doing multitasking.  It has been especially difficult for him as he needs to work on different tasks/designing.  He tends to be distracted very easily when there is background chatting, getting emails, and having other responsibilities.  He is unable to read chapters due to issues with focus.  He feels like he has "bunch of bees flying in his head" when he talks with other people as well.  He tends to be forgetful.   Depression-he denies feeling depressed.  He only has very few days of anhedonia at times.  He believes that his mood symptoms has been getting better since being started on venlafaxine.  Although he occasionally has anxiety, it has  been getting better.  He tries to combat his irrational thoughts.  He is using techniques of cognitive restructuring, which he learned through therapy.  He denies panic attacks.  He sleeps well.  He denies irritability.  He denies SI.   Substance use-he has not drink alcohol since New Year's as he is trying to lose weight for rock climbing.  He denies drug use.   Medication- venlafaxine 150 mg daily (for six months with good effect)  Exercise:rock climbing Support: parents Household: single Marital status:  single Number of children: 0  Employment:  [employer] for three years Education:  [employer] PCP / ongoing medical evaluation:   He grew up in [place].  He reports good relationship with his parents, who he talks twice a week.  He has a brother in [place], who is graduating from medical school.   Associated Signs/Symptoms: Depression Symptoms:  anhedonia, (Hypo) Manic Symptoms:   denies decreased need for sleep, euphoria Anxiety Symptoms:   mild anxiety Psychotic Symptoms:   denies AH, VH, paranoia PTSD Symptoms: Negative  Past Psychiatric History:  Outpatient: depression, anxiety since high school, ADHD Psychiatry admission: denies  Previous suicide attempt: denies  Past trials of medication: lexapro, fluoxetine, venlafaxine, bupropion, Ritalin (felt spacy), Concerta, Focalin, Adderall, Vyvanse, Strattera (he had some side effect from stimulant, including nausea.) History of violence:    Previous Psychotropic Medications: Yes   Substance Abuse History in the last 12 months:  No.  Consequences of Substance Abuse: NA  Past Medical History: No past medical history on file. No past surgical history on file.  Family Psychiatric History: as below  Family History:  Family History  Problem Relation Age of Onset   Depression Mother    Healthy Mother    Healthy Father    Anxiety disorder Brother     Social History:   Social History   Socioeconomic History   Marital status: Single    Spouse name: Not on file   Number of children: Not on file   Years of education: Not on file   Highest education level: Not on file  Occupational History   Not on file  Tobacco Use   Smoking status: Never   Smokeless tobacco: Never  Vaping Use   Vaping Use: Never used  Substance and Sexual Activity   Alcohol use: Yes    Alcohol/week: 1.0 standard drink    Types: 1 Glasses of wine per week    Comment: drinks no more than 2 beers on occassion.     Drug use: Never    Comment: No illicit drug use   Sexual activity: Not Currently  Other Topics Concern   Not on file  Social History Narrative   Not on file   Social Determinants of Health   Financial Resource Strain: Low Risk    Difficulty of Paying Living Expenses: Not hard at all  Food Insecurity: No Food Insecurity   Worried About Charity fundraiser in the Last Year: Never true   [place] in the Last Year: Never true  Transportation Needs: No Transportation Needs   Lack of Transportation (Medical): No   Lack of Transportation (Non-Medical): No  Physical Activity: Sufficiently Active   Days of Exercise per Week: 5 days   Minutes of Exercise per Session: 60 min  Stress: Stress Concern Present   Feeling of Stress : To some extent  Social Connections: Socially Isolated   Frequency of Communication with Friends  and Family: Once a week   Frequency of Social Gatherings with Friends and Family: Three times a week   Attends Religious Services: Never   Active Member of Clubs or Organizations: No   Attends Archivist Meetings: Never   Marital Status: Never married    Additional Social History: as above  Allergies:  No Known Allergies  Metabolic Disorder Labs: No results found for: HGBA1C, MPG No results found for: PROLACTIN Lab Results  Component Value Date   CHOL 194 05/16/2021   TRIG 65.0 05/16/2021   HDL 49.80 05/16/2021   CHOLHDL 4 05/16/2021   VLDL 13.0 05/16/2021   LDLCALC 131 (H) 05/16/2021   LDLCALC 122 (H) 02/07/2020   Lab Results  Component Value Date   TSH 2.31 02/04/2019    Therapeutic Level Labs: No results found for: LITHIUM No results found for: CBMZ No results found for: VALPROATE  Current Medications: Current Outpatient Medications  Medication Sig Dispense Refill   [START ON 07/24/2021] venlafaxine XR (EFFEXOR-XR) 150 MG 24 hr capsule Take 1 capsule (150 mg total) by mouth daily. 90 capsule 0   No current facility-administered  medications for this visit.    Musculoskeletal: Strength & Muscle Tone:  N/A Gait & Station:  N/A Patient leans: N/A  Psychiatric Specialty Exam: Review of Systems  Psychiatric/Behavioral:  Positive for decreased concentration. Negative for agitation, behavioral problems, confusion, dysphoric mood, hallucinations, self-injury, sleep disturbance and suicidal ideas. The patient is nervous/anxious. The patient is not hyperactive.   All other systems reviewed and are negative.  There were no vitals taken for this visit.There is no height or weight on file to calculate BMI.  General Appearance: Fairly Groomed  Eye Contact:  Good  Speech:  Clear and Coherent  Volume:  Normal  Mood:   good  Affect:  Appropriate, Congruent, and calm  Thought Process:  Coherent  Orientation:  Full (Time, Place, and Person)  Thought Content:  Logical  Suicidal Thoughts:  No  Homicidal Thoughts:  No  Memory:  Immediate;   Good  Judgement:  Good  Insight:  Good  Psychomotor Activity:  Normal  Concentration:  Concentration: Good and Attention Span: Good  Recall:  Good  Fund of Knowledge:Good  Language: Good  Akathisia:  No  Handed:  Right  AIMS (if indicated):  not done  Assets:  Communication Skills Desire for Improvement  ADL's:  Intact  Cognition: WNL  Sleep:  Good   Screenings: GAD-7    Flowsheet Row Counselor from 07/17/2021 in [facility] Office Visit from 05/08/2021 in [facility] Visit from 11/20/2020 in [facility] Video Visit from 10/05/2020 in [facility] Visit from 02/07/2020 in [facility]  Total GAD-7 Score 6 2 2 16 2       PHQ2-9    Flowsheet Row Video Visit from 07/17/2021 in [facility] Most recent reading at 07/17/2021  4:19 PM Counselor from 07/17/2021 in [facility] Most recent reading at 07/17/2021  10:07 AM Office Visit from 05/08/2021 in [facility] Most recent reading at 05/08/2021  3:36 PM Office Visit from 11/20/2020 in [facility] Most recent reading at 11/20/2020  8:30 AM Video Visit from 10/05/2020 in [facility] Most recent reading at 10/05/2020 10:59 AM  PHQ-2 Total Score 1 0 0 0 2  PHQ-9 Total Score -- -- 5 1 14       Flowsheet  Row Video Visit from 07/17/2021 in [facility] Most recent reading at 07/17/2021  4:19 PM Counselor [facility]17/2021 in [facility] Most recent reading at 07/17/2021 10:07 AM  C-SSRS RISK CATEGORY No Risk No Risk       Assessment and Plan:  Craig Andrade is a 31 y.o. year old male with a history of depression, anxiety, who is referred for inattention.   1. Attention deficit hyperactivity disorder (ADHD), unspecified ADHD type He reports ADHD symptoms since childhood, and has had more difficulty at work lately.  He does not recall having any neuropsych testing in the past, and is willing to pursue this.  Will make referral for evaluation of ADHD.  We plan to start Concerta to target ADHD symptoms after obtaining UDS; will start from lower dose given his history of adverse reaction from stimulant.  Discussed potential risks, which includes but not limited to headache, palpitation, worsening in anxiety, insomnia.   2. MDD (major depressive disorder), recurrent, in partial remission ([place]) He reports good benefit from venlafaxine, and denies any significant mood symptoms.  He enjoys socializing his friends, and reports good relationship with his family.  Will continue current dose to target venlafaxine.   Plan Obtain UDS Start concerta 18 mg daily after obtaining UDS Continue venlafaxine 150 mg daily  Next appointment: 3.6 at 4 PM for 30 mins, video [email or web address]  The patient demonstrates the following risk factors for suicide: Chronic  risk factors for suicide include: psychiatric disorder of depression . Acute risk factors for suicide include: N/A. Protective factors for this patient include: coping skills and hope for the future. Considering these factors, the overall suicide risk at this point appears to be low. Patient is appropriate for outpatient follow up.       Norman Clay, MD 2/7/20234:58 PM

## 2021-07-17 NOTE — Progress Notes (Signed)
Comprehensive Clinical Assessment (CCA) Note  07/17/2021 KALYNN BURMEISTER [id number]  Chief Complaint:  Chief Complaint  Patient presents with   Depression    Taking Effexor    Anxiety   ADHD    Looking for treatment    Visit Diagnosis: depression, anxiety, and ADHD    Client is a 31 year old male. Client is referred by [facility]  for a depression, anxiety, ADHD.Marland Kitchen   Client states mental health symptoms as evidenced by:   Depression Difficulty Concentrating; Worthlessness Difficulty Concentrating; Worthlessness  Duration of Depressive Symptoms Greater than two weeks Greater than two weeks      Anxiety Difficulty concentrating; Tension; Worrying; Restlessness Difficulty concentrating; Tension; Worrying; Restlessness  Psychosis None None  Trauma None None  Obsessions None None  Compulsions None None  Inattention Disorganized; Forgetful; Poor follow-through on tasks; Symptoms before age 15; Symptoms present in 2 or more settings Disorganized; Forgetful; Poor follow-through on tasks; Symptoms before age 30; Symptoms present in 2 or more settings  Hyperactivity/Impulsivity Runs and climbs; Symptoms present before age 22; Fidgets with hands/feet; Several symptoms present in 2 of more settings Runs and climbs; Symptoms present before age 6; Fidgets with hands/feet; Several symptoms present in 2 of more settings  Oppositional/Defiant Behaviors None None  Emotional Irregularity None None    Client denies suicidal and homicidal ideations at this time  Client denies hallucinations and delusions at this time   Client was screened for the following SDOH: Stress\tension and social interaction  Assessment Information that integrates subjective and objective details with a therapist's professional interpretation:     Patient was alert and oriented x5.  Patient was pleasant, cooperative, maintained good eye contact.  Wyatt presented today with euthymic mood\affect.  He was  dressed casually and engaged in therapy session,.  Patient comes in today as a referral from his primary care physician [facility].  Patient states that he has a history of depression, anxiety, and ADHD.  He reports that he is currently being managed on Effexor that is being prescribed by his PCP.  Patient reports he is interested in ADHD treatment which she has been diagnosed with since he was in middle school.  He reports that he has a history of Adderall since middle school.  Patient reports that he took that through college but stopped taking it due to how it made him feel.  Patient reports recently he has been starting to struggle with work with disorganization, poor follow-through on task, forgetfulness, and restlessness.  He reports that this is happening in 2 settings work and at home.  Patient reports good support systems with friends and family.  Patient reports his family lives in [place] where he is from.  Patient reports good support system with friends who he goes rockclimbing with 3 days a week.  Gio states that he moved down here for a job after he graduated from the [employer] he came down here as a [employer] for [employer].  LCSW notes that patient has private insurance and cannot be seen at [facility] due to him not being a Medicaid or indigent.  Patient referred to [facility] health sister facility that takes on this type of insurance.  Client meets criteria for: ADHD, depression, anxiety  Client states use of the following substances: None reported    Client was in agreement with treatment recommendations.   CCA Screening, Triage and Referral (STR)  Patient Reported Information  How did you hear about [place]? Primary Care  Referral name: PCP family  Referral phone number: No data recorded  Whom do you see for routine medical problems? Primary Care  [facility]ractice/Facility [facility]    How Long Has This Been Causing You Problems? > than 6 months  What Do You Feel Would Help You the Most Today? Treatment for Depression or other mood problem   Have You Recently Been in Any Inpatient Treatment (Hospital/Detox/Crisis Center/28-Day Program)? No   Have You Ever Received Services From [facility] Before? Yes  Who Do You See at [facility]? [place]   Have You Recently Had Any Thoughts About Hurting Yourself? No  Are You Planning to Commit Suicide/Harm Yourself At This time? No   Have you Recently Had Thoughts About Hurting Someone Karolee Ohs? No  Explanation: No data recorded  Have You Used Any Alcohol or Drugs in the Past 24 Hours? No    Do You Currently Have a Therapist/Psychiatrist? No   Have You Been Recently Discharged From Any Office Practice or Programs? No     CCA Screening Triage Referral Assessment Type of Contact: Face-to-Face  Is CPS involved or ever been involved? Never  Is APS involved or ever been involved? Never   Patient Determined To Be At Risk for Harm To Self or Others Based on Review of Patient Reported Information or Presenting Complaint? No   Location of Assessment: [facility]   Does Patient Present under Involuntary Commitment? No  IVC Papers Initial File Date: No data recorded  [place] of Residence: [place]   Patient Currently Receiving the Following Services: No data recorded  Determination of Need: No data recorded  Options For Referral: Medication Management     CCA Biopsychosocial Intake/Chief Complaint:  Pt reports taht he has been Dx with ADHD in middle school. He was taking medications until college because he did not like how it made him feel. So he stopped taking aderrall  Current Symptoms/Problems: poor focus at work, forgetful between tasks, poor organization of thought process   Patient Reported Schizophrenia/Schizoaffective Diagnosis in Past: No   Strengths: willing to  engage in treatment.  Preferences: none reported  Abilities: rocking climbing, hard worker,   Type of Services Patient Feels are Needed: medication mgnt   Initial Clinical Notes/Concerns: poor follow through on tasks at work.   Mental Health Symptoms Depression:   Difficulty Concentrating; Worthlessness   Duration of Depressive symptoms:  Greater than two weeks   Mania:  No data recorded  Anxiety:    Difficulty concentrating; Tension; Worrying; Restlessness   Psychosis:   None   Duration of Psychotic symptoms: No data recorded  Trauma:   None   Obsessions:   None   Compulsions:   None   Inattention:   Disorganized; Forgetful; Poor follow-through on tasks; Symptoms before age 57; Symptoms present in 2 or more settings   Hyperactivity/Impulsivity:   Runs and climbs; Symptoms present before age 77; Fidgets with hands/feet; Several symptoms present in 2 of more settings   Oppositional/Defiant Behaviors:   None   Emotional Irregularity:   None   Other Mood/Personality Symptoms:  No data recorded   Mental Status Exam Appearance and self-care  Stature:   Average   Weight:   Average weight   Clothing:   Casual   Grooming:   Normal   Cosmetic use:   None   Posture/gait:   Normal   Motor activity:   Not Remarkable  Sensorium  Attention:   Normal   Concentration:   Normal   Orientation:   X5   Recall/memory:   Normal   Affect and Mood  Affect:   Anxious   Mood:   Anxious; Depressed   Relating  Eye contact:   Normal   Facial expression:   Depressed   Attitude toward examiner:   Cooperative   Thought and Language  Speech flow:  Clear and Coherent   Thought content:   Appropriate to Mood and Circumstances   Preoccupation:   None   Hallucinations:   None   Organization:  No data recorded  [employer] of Knowledge:   Good   Intelligence:   Average   Abstraction:   Functional   Judgement:    Fair   Reality Testing:  No data recorded  Insight:   Fair   Decision Making:   Normal   Social Functioning  Social Maturity:   Responsible   Social Judgement:  No data recorded  Stress  Stressors:   Work   Coping Ability:   Normal   Skill Deficits:  No data recorded  Supports:   Friends/Service system; Family     Religion: Religion/Spirituality Are You A Religious Person?: No  Leisure/Recreation: Leisure / Recreation Do You Have Hobbies?: Yes Leisure and Hobbies: rock climbing, [place] and dragons, working out.  Exercise/Diet: Exercise/Diet Do You Exercise?: Yes What Type of Exercise Do You Do?: Run/Walk, Weight Training, Other (Comment) (climbing) How Many Times a Week Do You Exercise?: 1-3 times a week Have You Gained or Lost A Significant Amount of Weight in the Past Six Months?: No Do You Follow a Special Diet?: No Do You Have Any Trouble Sleeping?: No   CCA Employment/Education Employment/Work Situation: Employment / Work Situation Employment Situation: Employed Where is Patient Currently Employed?: Engineer, structural Products: Public relations account executive How Long has Patient Been Employed?: 3 years Are You Satisfied With Your Job?: Yes Do You Work More Than One Job?: No Work Stressors: Pt has been unable taking on more tasks as he tried to advance his career. Patient's Job has Been Impacted by Current Illness: No Has Patient ever Been in the [employer]?: No  Education: Education Is Patient Currently Attending School?: No Last Grade Completed: 12 Did You Graduate From [employer]?: Yes Did You Attend College?: Yes What Type of College Degree Do you Have?: [employer] Did Fred[employer]?: No Did You Have An Individualized Education Program (IIEP): No Did You Have Any Difficulty At School?: No Patient's Education Has Been Impacted by Current Illness: No   CCA Family/Childhood History Family and Relationship History: Family history Marital  status: Single Are you sexually active?: No What is your sexual orientation?: hetrosexual Has your sexual activity been affected by drugs, alcohol, medication, or emotional stress?: none reported Does patient have children?: No  Childhood History:  Childhood History By whom was/is the patient raised?: Both parents Description of patient's relationship with caregiver when they were a child: good Patient's description of current relationship with people who raised him/her: good Does patient have siblings?: Yes Number of Siblings: 1 Description of patient's current relationship with siblings: good Did patient suffer any verbal/emotional/physical/sexual abuse as a child?: No Did patient suffer from severe childhood neglect?: No Has patient ever been sexually abused/assaulted/raped as an adolescent or adult?: No Was the patient ever a victim of a crime or a disaster?: No Witnessed domestic violence?: No Has patient been affected by domestic violence as an adult?:  No  Child/Adolescent Assessment:     CCA Substance Use Alcohol/Drug Use: Alcohol / Drug Use History of alcohol / drug use?: No history of alcohol / drug abuse      DSM5 Diagnoses: Patient Active Problem List   Diagnosis Date Noted   Attention deficit hyperactivity disorder (ADHD), combined type 07/17/2021   Sprain of coccyx 05/08/2021   Depression with anxiety 05/08/2021   Attention deficit 10/05/2020   Dory Horn, LCSW

## 2021-07-26 ENCOUNTER — Telehealth: Payer: Self-pay

## 2021-07-26 ENCOUNTER — Other Ambulatory Visit (HOSPITAL_COMMUNITY): Payer: Self-pay | Admitting: Psychiatry

## 2021-07-26 DIAGNOSIS — R41840 Attention and concentration deficit: Secondary | ICD-10-CM | POA: Diagnosis not present

## 2021-07-26 NOTE — Telephone Encounter (Signed)
Labs - Patient called from [facility] stating he was there for ordered drug screen by Dr. Modesta Messing but that [facility] was not able to pull up the order.  Reviewed pt's record and faxed this to the [facility] as patient verified their fax # [phone]. Patient to call back if any further problems getting ordered labs.

## 2021-07-27 LAB — DRUG SCREEN, URINE
Amphetamines, Urine: NEGATIVE ng/mL
Barbiturate screen, urine: NEGATIVE ng/mL
Benzodiazepine Quant, Ur: NEGATIVE ng/mL
Cannabinoid Quant, Ur: NEGATIVE ng/mL
Cocaine (Metab.): NEGATIVE ng/mL
Opiate Quant, Ur: NEGATIVE ng/mL
PCP Quant, Ur: NEGATIVE ng/mL

## 2021-08-10 ENCOUNTER — Other Ambulatory Visit: Payer: Self-pay | Admitting: Psychiatry

## 2021-08-10 MED ORDER — METHYLPHENIDATE HCL ER (OSM) 18 MG PO TBCR: 18.0000 mg | EXTENDED_RELEASE_TABLET | Freq: Every day | ORAL | 0 refills | Status: DC

## 2021-08-10 NOTE — Telephone Encounter (Signed)
UDS was negative. Ordered Concerta. Left voice message to the patient that the medication was sent in.  ?

## 2021-08-10 NOTE — Progress Notes (Signed)
Virtual Visit via Video Note  I connected with Craig SpanZachary M Fetch on 08/13/21 at  4:00 PM EST by a video enabled telemedicine application and verified that I am speaking with the correct person using two identifiers.  Location: Patient: home Provider: office Persons participated in the visit- patient, provider    I discussed the limitations of evaluation and management by telemedicine and the availability of in person appointments. The patient expressed understanding and agreed to proceed.   I discussed the assessment and treatment plan with the patient. The patient was provided an opportunity to ask questions and all were answered. The patient agreed with the plan and demonstrated an understanding of the instructions.   The patient was advised to call back or seek an in-person evaluation if the symptoms worsen or if the condition fails to improve as anticipated.  I provided 16 minutes of non-face-to-face time during this encounter.   Neysa Hottereina Janell Keeling, MD    [facility] OP Progress Note  08/13/2021 4:33 PM Craig Andrade  MRN:  [id number]  Chief Complaint:  Chief Complaint  Patient presents with   Follow-up   ADHD   HPI:  This is a follow-up appointment for depression and ADHD.  He states that there has been no change since the last visit.  He continues to struggle with focus.  He tends to be distracted easily, and has had difficulty in completing tasks.  He has been busy at work, which requires him to do multitasking.  Although he has been able to complete dose works, it has been taking more time for him to do those.  He could not get Concerta due to shortage of medication at the pharmacy.  He is willing to try atomoxetine at this time as he does not recall any side effect/he tried this medication when he was a child.  His mood has been good except occasional anxiety.  He enjoys going to rock climbing, and doing yoga.  He sleeps well.  He denies feeling depressed or anhedonia.  He  denies change in appetite.  He denies SI.   Exercise:rock climbing Support: parents Household: single Marital status: single Number of children: 0  Employment:  Comptrollermechanical engineer for three years Education:  [employer]  Last PCP / ongoing medical evaluation:   He grew up in [place].  He reports good relationship with his parents, who he talks twice a week.  He has a brother in [place], who is graduating from medical school.     Visit Diagnosis:    ICD-10-CM   1. Inattention  R41.840 Ambulatory referral to Psychology    2. MDD (major depressive disorder), recurrent, in partial remission (HCC)  F33.41 venlafaxine XR (EFFEXOR-XR) 150 MG 24 hr capsule      Past Psychiatric History: Please see initial evaluation for full details. I have reviewed the history. No updates at this time.     Past Medical History: No past medical history on file. No past surgical history on file.  Family Psychiatric History: Please see initial evaluation for full details. I have reviewed the history. No updates at this time.     Family History:  Family History  Problem Relation Age of Onset   Depression Mother    Healthy Mother    Healthy Father    Anxiety disorder Brother     Social History:  Social History   Socioeconomic History   Marital status: Single    Spouse name: Not on file   Number  of children: Not on file   Years of education: Not on file   Highest education level: Not on file  Occupational History   Not on file  Tobacco Use   Smoking status: Never   Smokeless tobacco: Never  Vaping Use   Vaping Use: Never used  Substance and Sexual Activity   Alcohol use: Yes    Alcohol/week: 1.0 standard drink    Types: 1 Glasses of wine per week    Comment: drinks no more than 2 beers on occassion.    Drug use: Never    Comment: No illicit drug use   Sexual activity: Not Currently  Other Topics Concern   Not on file  Social History Narrative   Not on file    Social Determinants of Health   Financial Resource Strain: Low Risk    Difficulty of Paying Living Expenses: Not hard at all  Food Insecurity: No Food Insecurity   Worried About Programme researcher, broadcasting/film/video in the Last Year: Never true   Ran Out of Food in the Last Year: Never true  Transportation Needs: No Transportation Needs   Lack of Transportation (Medical): No   Lack of Transportation (Non-Medical): No  Physical Activity: Sufficiently Active   Days of Exercise per Week: 5 days   Minutes of Exercise per Session: 60 min  Stress: Stress Concern Present   Feeling of Stress : To some extent  Social Connections: Socially Isolated   Frequency of Communication with Friends and Family: Once a week   Frequency of Social Gatherings with Friends and Family: Three times a week   Attends Religious Services: Never   Active Member of Clubs or Organizations: No   Attends Banker Meetings: Never   Marital Status: Never married    Allergies: No Known Allergies  Metabolic Disorder Labs: No results found for: HGBA1C, MPG No results found for: PROLACTIN Lab Results  Component Value Date   CHOL 194 05/16/2021   TRIG 65.0 05/16/2021   HDL 49.80 05/16/2021   CHOLHDL 4 05/16/2021   VLDL 13.0 05/16/2021   LDLCALC 131 (H) 05/16/2021   LDLCALC 122 (H) 02/07/2020   Lab Results  Component Value Date   TSH 2.31 02/04/2019    Therapeutic Level Labs: No results found for: LITHIUM No results found for: VALPROATE No components found for:  CBMZ  Current Medications: Current Outpatient Medications  Medication Sig Dispense Refill   atomoxetine (STRATTERA) 40 MG capsule Take 1 capsule (40 mg total) by mouth daily. 30 capsule 1   methylphenidate 18 MG PO CR tablet Take 1 tablet (18 mg total) by mouth daily. 30 tablet 0   venlafaxine XR (EFFEXOR-XR) 150 MG 24 hr capsule Take 1 capsule (150 mg total) by mouth daily. 30 capsule 2   No current facility-administered medications for this  visit.     Musculoskeletal: Strength & Muscle Tone:  N/A Gait & Station:  N/A Patient leans: N/A  Psychiatric Specialty Exam: Review of Systems  Psychiatric/Behavioral:  Positive for decreased concentration. Negative for agitation, behavioral problems, confusion, dysphoric mood, hallucinations, self-injury, sleep disturbance and suicidal ideas. The patient is nervous/anxious. The patient is not hyperactive.   All other systems reviewed and are negative.  There were no vitals taken for this visit.There is no height or weight on file to calculate BMI.  General Appearance: Fairly Groomed  Eye Contact:  Good  Speech:  Clear and Coherent  Volume:  Normal  Mood:   good  Affect:  Appropriate, Congruent,  and euthymic  Thought Process:  Coherent  Orientation:  Full (Time, Place, and Person)  Thought Content: Logical   Suicidal Thoughts:  No  Homicidal Thoughts:  No  Memory:  Immediate;   Good  Judgement:  Good  Insight:  Good  Psychomotor Activity:  Normal  Concentration:  Concentration: Good and Attention Span: Good  Recall:  Good  Fund of Knowledge: Good  Language: Good  Akathisia:  No  Handed:  Right  AIMS (if indicated): not done  Assets:  Communication Skills Desire for Improvement  ADL's:  Intact  Cognition: WNL  Sleep:  Good   Screenings: GAD-7    Advertising copywriter from 07/17/2021 in [facility] Office Visit from 05/08/2021 in [facility] Office Visit from 11/20/2020 in [facility] Video Visit from 10/05/2020 in [facility] Office Visit from 02/07/2020 in [facility]  Total GAD-7 Score 6 2 2 16 2       PHQ2-9    Flowsheet Row Video Visit from 07/17/2021 in [facility] Most recent reading at 07/17/2021  4:19 PM Counselor from 07/17/2021 in [facility] Most recent reading at 07/17/2021 10:07 AM Office  Visit from 05/08/2021 in [facility] Most recent reading at 05/08/2021  3:36 PM Office Visit from 11/20/2020 in [facility] Most recent reading at 11/20/2020  8:30 AM Video Visit from 10/05/2020 in [facility] Most recent reading at 10/05/2020 10:59 AM  PHQ-2 Total Score 1 0 0 0 2  PHQ-9 Total Score -- -- 5 1 14       Flowsheet Row Video Visit from 07/17/2021 in [facility] Most recent reading at 07/17/2021  4:19 PM Counselor from 07/17/2021 in [facility] Most recent reading at 07/17/2021 10:07 AM  C-SSRS RISK CATEGORY No Risk No Risk        Assessment and Plan:  Craig Andrade is a 31 y.o. year old male with a history of  depression, anxiety, who presents for follow up appointment for below.   1. MDD (major depressive disorder), recurrent, in partial remission (HCC) He denies significant mood symptoms except occasional self-limiting anxiety.  Will continue current dose of venlafaxine to target depression.   2. Inattention He continues to struggle with symptoms of ADHD, and has had more difficulty at work.  He reportedly has ADHD symptoms since childhood, and is willing to have evaluation of ADHD.  We will start atomoxetine given there is lack of supply of stimulants.  Will monitor LFT as needed.   Plan Start atomoxetine 40 mg daily (LFT wnl 05/2021) Continue venlafaxine 150 mg daily  (He was unable to start concerta due to lack of supply) Next appointment: 4/11 at 2 PM for 30 mins, in person, video [email or web address] Referral to evaluation of ADHD  Past trials of medication: lexapro, fluoxetine, venlafaxine, bupropion, Ritalin (felt spacy), Concerta, Focalin, Adderall, Vyvanse, Strattera (he had some side effect from stimulant, including nausea.)   The patient demonstrates the following risk factors for suicide: Chronic risk factors for suicide include: psychiatric  disorder of depression . Acute risk factors for suicide include: N/A. Protective factors for this patient include: coping skills and hope for the future. Considering these factors, the overall suicide risk at this point appears to be low. Patient is appropriate for outpatient follow up.           Collaboration of Care: Collaboration of  Care: Other referral as above  Patient/Guardian was advised Release of Information must be obtained prior to any record release in order to collaborate their care with an outside provider. Patient/Guardian was advised if they have not already done so to contact the registration department to sign all necessary forms in order for [place] to release information regarding their care.   Consent: Patient/Guardian gives verbal consent for treatment and assignment of benefits for services provided during this visit. Patient/Guardian expressed understanding and agreed to proceed.    Neysa Hotter, MD 08/13/2021, 4:33 PM

## 2021-08-13 ENCOUNTER — Other Ambulatory Visit: Payer: Self-pay

## 2021-08-13 ENCOUNTER — Telehealth (INDEPENDENT_AMBULATORY_CARE_PROVIDER_SITE_OTHER): Payer: No Typology Code available for payment source | Admitting: Psychiatry

## 2021-08-13 ENCOUNTER — Telehealth: Payer: Self-pay

## 2021-08-13 ENCOUNTER — Encounter: Payer: Self-pay | Admitting: Psychiatry

## 2021-08-13 DIAGNOSIS — F3341 Major depressive disorder, recurrent, in partial remission: Secondary | ICD-10-CM

## 2021-08-13 DIAGNOSIS — R41840 Attention and concentration deficit: Secondary | ICD-10-CM | POA: Diagnosis not present

## 2021-08-13 MED ORDER — VENLAFAXINE HCL ER 150 MG PO CP24: 150.0000 mg | ORAL_CAPSULE | Freq: Every day | ORAL | 2 refills | Status: DC

## 2021-08-13 MED ORDER — ATOMOXETINE HCL 40 MG PO CAPS: 40.0000 mg | ORAL_CAPSULE | Freq: Every day | ORAL | 1 refills | Status: DC

## 2021-08-13 NOTE — Telephone Encounter (Signed)
left message to call office back with the updated information  ?

## 2021-08-13 NOTE — Telephone Encounter (Signed)
prior auth for the atomoxetine hcl 40mg  was approved from 08-13-21 to 08-12-24 ?

## 2021-08-13 NOTE — Patient Instructions (Addendum)
Start atomoxetine 40 mg daily  ?Continue venlafaxine 150 mg daily  ?Next appointment: 4/11 at 2 PM, in person ?Referral for evaluation of ADHD ? ?The next visit will be in person visit. Please arrive 15 mins before the scheduled time.  ? ? [facility]  ?Address: [address], [place], [place] [address]   ?

## 2021-08-13 NOTE — Telephone Encounter (Signed)
went online to [email or web address] and submitted the prior auth . - pending 

## 2021-08-13 NOTE — Telephone Encounter (Signed)
received a message from front desk to call pt about pharmacy changing and medications.  ?

## 2021-08-13 NOTE — Telephone Encounter (Signed)
received notice threw [email or web address] that a prior auth was needed atomoxetine  ?

## 2021-08-15 ENCOUNTER — Telehealth: Payer: Self-pay

## 2021-08-15 NOTE — Telephone Encounter (Signed)
received a notice that [email or web address] that they need a prior auth on the methylphenidate.   went online and completed the prior auth - pending  ?

## 2021-08-15 NOTE — Telephone Encounter (Signed)
methylphenidate hcl er was approved from 08-15-21 to  08-14-24  also received notice that the atomoxetine hcl was approved from 08-13-21 to 08-12-24. ? ? ?

## 2021-09-07 ENCOUNTER — Other Ambulatory Visit: Payer: Self-pay | Admitting: Psychiatry

## 2021-09-07 DIAGNOSIS — F3341 Major depressive disorder, recurrent, in partial remission: Secondary | ICD-10-CM

## 2021-09-16 NOTE — Progress Notes (Signed)
Virtual Visit via Video Note ? ?I connected with Craig Andrade on 09/18/21 at  2:00 PM EDT by a video enabled telemedicine application and verified that I am speaking with the correct person using two identifiers. ? ?Location: ?Patient: home ?Provider: office ?Persons participated in the visit- patient, provider  ?  ?I discussed the limitations of evaluation and management by telemedicine and the availability of in person appointments. The patient expressed understanding and agreed to proceed. ? ?  ?I discussed the assessment and treatment plan with the patient. The patient was provided an opportunity to ask questions and all were answered. The patient agreed with the plan and demonstrated an understanding of the instructions. ?  ?The patient was advised to call back or seek an in-person evaluation if the symptoms worsen or if the condition fails to improve as anticipated. ? ?I provided 17 minutes of non-face-to-face time during this encounter. ? ? ?Neysa Hottereina Shana Younge, MD ? ? ? ?BH MD/PA/NP OP Progress Note ? ?09/18/2021 2:38 PM ?Craig Andrade  ?MRN:  [id number] ? ?Chief Complaint:  ?Chief Complaint  ?Patient presents with  ? Follow-up  ? Depression  ? ADHD  ? ?HPI:  ?This is a follow-up appointment for depression and inattention.  ?He states that he has been doing better since taking atomoxetine.  He focuses better, and has been able to switch tasks easier.  Although he tends to be busy at work, he has been handling things better.  He also notices that he does not let the drawer leave open as much compared to before.  He enjoys going to rock climbing.  There were a few times he felt depressed.  It was a time he had some bad day at work.  Although it used to fall into more depression, he was feeling better the next day.  Although he feels anxious at times, he tries to combat it, and he denies any concern.  He denies panic attacks.  He sleeps good.  He denies change in appetite.  He denies SI.  Although he does wish  his attention to be better, he also feels concerned of uptitration of the medication at this time due to uncertainty of the side effect.  He feels comfortable to stay on the current medication at this time. He has an upcoming evaluation of ADHD. He denies alcohol use or drug use.  ? ?Exercise:rock climbing ?Support: parents ?Household: single ?Marital status: single ?Number of children: 0  ?Employment:  Comptrollermechanical engineer for three years ?Education:  [employer]  ?Last PCP / ongoing medical evaluation:   ?He grew up in [place].  He reports good relationship with his parents, who he talks twice a week.  He has a brother in [place], who is graduating from medical school.  ? ?Visit Diagnosis:  ?  ICD-10-CM   ?1. MDD (major depressive disorder), recurrent, in partial remission (HCC)  F33.41   ?  ?2. Inattention  R41.840   ?  ? ? ?Past Psychiatric History: Please see initial evaluation for full details. I have reviewed the history. No updates at this time.  ?  ? ?Past Medical History: No past medical history on file. No past surgical history on file. ? ?Family Psychiatric History: Please see initial evaluation for full details. I have reviewed the history. No updates at this time.  ?  ? ?Family History:  ?Family History  ?Problem Relation Age of Onset  ? Depression Mother   ? Healthy Mother   ?  Healthy Father   ? Anxiety disorder Brother   ? ? ?Social History:  ?Social History  ? ?Socioeconomic History  ? Marital status: Single  ?  Spouse name: Not on file  ? Number of children: Not on file  ? Years of education: Not on file  ? Highest education level: Not on file  ?Occupational History  ? Not on file  ?Tobacco Use  ? Smoking status: Never  ? Smokeless tobacco: Never  ?Vaping Use  ? Vaping Use: Never used  ?Substance and Sexual Activity  ? Alcohol use: Yes  ?  Alcohol/week: 1.0 standard drink  ?  Types: 1 Glasses of wine per week  ?  Comment: drinks no more than 2 beers on occassion.   ? Drug  use: Never  ?  Comment: No illicit drug use  ? Sexual activity: Not Currently  ?Other Topics Concern  ? Not on file  ?Social History Narrative  ? Not on file  ? ?Social Determinants of Health  ? ?Financial Resource Strain: Low Risk   ? Difficulty of Paying Living Expenses: Not hard at all  ?Food Insecurity: No Food Insecurity  ? Worried About Programme researcher, broadcasting/film/video in the Last Year: Never true  ? Ran Out of Food in the Last Year: Never true  ?Transportation Needs: No Transportation Needs  ? Lack of Transportation (Medical): No  ? Lack of Transportation (Non-Medical): No  ?Physical Activity: Sufficiently Active  ? Days of Exercise per Week: 5 days  ? Minutes of Exercise per Session: 60 min  ?Stress: Stress Concern Present  ? Feeling of Stress : To some extent  ?Social Connections: Socially Isolated  ? Frequency of Communication with Friends and Family: Once a week  ? Frequency of Social Gatherings with Friends and Family: Three times a week  ? Attends Religious Services: Never  ? Active Member of Clubs or Organizations: No  ? Attends Banker Meetings: Never  ? Marital Status: Never married  ? ? ?Allergies: No Known Allergies ? ?Metabolic Disorder Labs: ?No results found for: HGBA1C, MPG ?No results found for: PROLACTIN ?Lab Results  ?Component Value Date  ? CHOL 194 05/16/2021  ? TRIG 65.0 05/16/2021  ? HDL 49.80 05/16/2021  ? CHOLHDL 4 05/16/2021  ? VLDL 13.0 05/16/2021  ? LDLCALC 131 (H) 05/16/2021  ? LDLCALC 122 (H) 02/07/2020  ? ?Lab Results  ?Component Value Date  ? TSH 2.31 02/04/2019  ? ? ?Therapeutic Level Labs: ?No results found for: LITHIUM ?No results found for: VALPROATE ?No components found for:  CBMZ ? ?Current Medications: ?Current Outpatient Medications  ?Medication Sig Dispense Refill  ? [START ON 10/13/2021] atomoxetine (STRATTERA) 40 MG capsule Take 1 capsule (40 mg total) by mouth daily. 30 capsule 0  ? venlafaxine XR (EFFEXOR-XR) 150 MG 24 hr capsule Take 1 capsule (150 mg total) by mouth  daily. 90 capsule 0  ? ?No current facility-administered medications for this visit.  ? ? ? ?Musculoskeletal: ?Strength & Muscle Tone: within normal limits ?Gait & Station: normal ?Patient leans: N/A ? ?Psychiatric Specialty Exam: ?Review of Systems  ?Psychiatric/Behavioral:  Positive for decreased concentration and dysphoric mood. Negative for agitation, behavioral problems, confusion, hallucinations, self-injury, sleep disturbance and suicidal ideas. The patient is nervous/anxious. The patient is not hyperactive.   ?All other systems reviewed and are negative.  ?There were no vitals taken for this visit.There is no height or weight on file to calculate BMI.  ?General Appearance: Fairly Groomed  ?Eye Contact:  Good  ?Speech:  Clear and Coherent  ?Volume:  Normal  ?Mood:   good  ?Affect:  Appropriate, Congruent, and Full Range  ?Thought Process:  Coherent  ?Orientation:  Full (Time, Place, and Person)  ?Thought Content: Logical   ?Suicidal Thoughts:  No  ?Homicidal Thoughts:  No  ?Memory:  Immediate;   Good  ?Judgement:  Good  ?Insight:  Good  ?Psychomotor Activity:  Normal  ?Concentration:  Concentration: Good and Attention Span: Good  ?Recall:  Good  ?Fund of Knowledge: Good  ?Language: Good  ?Akathisia:  No  ?Handed:  Right  ?AIMS (if indicated): not done  ?Assets:  Communication Skills ?Desire for Improvement  ?ADL's:  Intact  ?Cognition: WNL  ?Sleep:  Good  ? ?Screenings: ?GAD-7   ? ?Flowsheet Row Counselor from 07/17/2021 in [facility] Office Visit from 05/08/2021 in [facility] Office Visit from 11/20/2020 in [facility] Video Visit from 10/05/2020 in [facility] Office Visit from 02/07/2020 in [facility]  ?Total GAD-7 Score 6 2 2 16 2   ? ?  ? ?PHQ2-9   ? ?Flowsheet Row Video Visit from 07/17/2021 in [facility] ?Most recent reading at 07/17/2021  4:19 PM Counselor from  07/17/2021 in [facility] ?Most recent reading at 07/17/2021 10:07 AM Office Visit from 05/08/2021 in [facility] ?Most recent reading at 05/08/2021  3:36 PM

## 2021-09-18 ENCOUNTER — Encounter: Payer: Self-pay | Admitting: Psychiatry

## 2021-09-18 ENCOUNTER — Telehealth (INDEPENDENT_AMBULATORY_CARE_PROVIDER_SITE_OTHER): Payer: BC Managed Care – PPO | Admitting: Psychiatry

## 2021-09-18 DIAGNOSIS — R41840 Attention and concentration deficit: Secondary | ICD-10-CM

## 2021-09-18 DIAGNOSIS — F3341 Major depressive disorder, recurrent, in partial remission: Secondary | ICD-10-CM

## 2021-09-18 MED ORDER — ATOMOXETINE HCL 40 MG PO CAPS: 40.0000 mg | ORAL_CAPSULE | Freq: Every day | ORAL | 0 refills | Status: DC

## 2021-09-18 NOTE — Patient Instructions (Signed)
Continue atomoxetine 40 mg daily  ?Continue venlafaxine 150 mg daily  ?Next appointment: 6/1 at 8 AM, in person ?

## 2021-10-11 ENCOUNTER — Other Ambulatory Visit: Payer: Self-pay | Admitting: Psychiatry

## 2021-10-12 ENCOUNTER — Ambulatory Visit (INDEPENDENT_AMBULATORY_CARE_PROVIDER_SITE_OTHER): Payer: BC Managed Care – PPO | Admitting: Psychology

## 2021-10-12 DIAGNOSIS — F89 Unspecified disorder of psychological development: Secondary | ICD-10-CM | POA: Diagnosis not present

## 2021-10-12 NOTE — Progress Notes (Addendum)
Date: 10/12/2021 ?Appointment Start Time: 1pm ?Duration: 90 minutes ?Provider: Helmut Muster, PsyD ?Type of Session: Initial Appointment for Evaluation  ?Location of Patient: Home ?Location of Provider: Provider's Home (private office) ?Type of Contact: WebEx video visit with audio ? ?Session Content:  Prior to proceeding with today's appointment, two pieces of identifying information were obtained from [facility] to verify identity. In addition, Romero's physical location at the time of this appointment was obtained. In the event of technical difficulties, Masson shared a phone number he could be reached at. Juanjose and this provider participated in today's telepsychological service. Orvan denied anyone else being present in the room or on the virtual appointment. ? ?The provider's role was explained to [place]. The provider reviewed and discussed issues of confidentiality, privacy, and limits therein (e.g., reporting obligations). In addition to verbal informed consent, written informed consent for psychological services was obtained from [place] prior to the initial appointment. Written consent included information concerning the practice, financial arrangements, and confidentiality and patients' rights. Since the clinic is not a 24/7 crisis center, mental health emergency resources were shared, and the provider explained e-mail, voicemail, and/or other messaging systems should be utilized only for non-emergency reasons. This provider also explained that information obtained during appointments will be placed in their electronic medical record in a confidential manner. Faye verbally acknowledged understanding of the aforementioned and agreed to use mental health emergency resources discussed if needed. Moreover, Jaysiah agreed information may be shared with other [facility] or their referring provider(s) as needed for coordination of care. By signing the new patient documents, Jossue provided written consent  for coordination of care. Steaven verbally acknowledged understanding he is ultimately responsible for understanding his insurance benefits as it relates to reimbursement of telepsychological and in-person services. This provider also reviewed confidentiality, as it relates to telepsychological services, as well as the rationale for telepsychological services. This provider further explained that video should not be captured, photos should not be taken, nor should testing stimuli be copied or recorded as it would be a copyright violation. Keishaun expressed understanding of the aforementioned, and verbally consented to proceed. ? ?Bayani completed the psychiatric diagnostic evaluation (history, including past, family, social, and  psychiatric history; behavioral observations; and establishment of a provisional diagnosis). The evaluation was completed in 90 minutes. Code 40347 was billed.  ? ?Mental Status Examination:  ?Appearance:  neat ?Behavior: appropriate to circumstances ?Mood: euthymic ?Affect: mood congruent ?Speech: WNL ?Eye Contact: appropriate ?Psychomotor Activity: WNL ?Thought Process: linear, logical, and goal directed and denies suicidal, homicidal, and self-harm ideation, plan and intent ?Content/Perceptual Disturbances: none ?Orientation: AAOx4 ?Cognition/Sensorium: intact ?Insight: good ?Judgment: good ? ?Provisional DSM-5 diagnosis(es):  ?F89 Unspecified Disorder of Psychological Development  ? ?Plan: Parsa is currently scheduled for an appointment on 10/25/2021 at 9am via WebEx video visit with audio.  ? ? ? ? ? ?CONFIDENTIAL PSYCHOLOGICAL EVALUATION ?______________________________________________________________________________ ? ?Name: Nthony Lefferts   ?Date of Birth: [birth year]    ?Age: 31 ?Dates of Evaluation: 10/12/2021 ? ?SOURCE AND REASON FOR REFERRAL: Mr. Marquel Pottenger was referred by Dr. Neysa Hotter for an evaluation to ascertain if he meets criteria for Attention Deficit/Hyperactivity  Disorder (ADHD).  ?  ?BACKGROUND INFORMATION AND PRESENTING PROBLEM: Mr. Jaecob Lowden is a 31 year old male who resides in [place], [place] ? ?Mr. Banner reported he was diagnosed with ADHD and prescribed various medications (e.g., Focalin, Strattera, and Ritalin) throughout childhood and college, stating he ?tried pretty much every [medication] in the book.? He further reported they were  generally helpful but some caused him to become sedated, ?spacey,? or significantly reduced his appetite. He indicated he is pursuing an ADHD evaluation as he has been unable to get his records from his PCP and psychiatrist from that time. He described his ADHD-related concerns as including alternating between concentration and attention shifting impairment (e.g., forgets tasks he was working on after switching to another task) and hyperfixation on tasks; experiencing irritability when he is interrupted as it can be a ?struggle? to get his attention back on tasks and/or the interruption may include another task he has to ?track;? difficulty listening when others are speaking to him; feeling restless when doing passive tasks (e.g., phone calls and watching TV) and regularly doing something ?more stimulating? (e.g., Reddit or video games) when engaged in them; problems maintaining organizational systems; planning difficulties that result in him ?double scheduling things;? commonly being forgetful (e.g., paying bills, misplacing items, problems remembering tasks he was doing, and leaving cupboards open); regularly fidgeting and squirming (e.g., ?picking [his] fingers and cuticles,? ?tapping,? and humming or singing to himself); a belief he talks too much because he commonly ?rambles? or switches topics while speaking; task initiation impairment that results in him relying on a sense of urgency to motivate him to start a task, occasional task maintenance issues, and task disengagement problems as he does not like  ?leaving something partly done;? occasionally interrupts others as he does not want to forget what he wants to say; waiting causes him to feel ?antsy? and he is told by others he struggles with patience; has a natural inclination to drive much faster than others and can become upset when someone is ?going slow? in front of him and having received a speeding ticket for going over the speed limit; tends to make impulsive shopping decisions while in grocery stores;  ?mind racing? that contributes to sleep onset issues as well as tossing and turning in his sleep; usually does not read or fully understand directions before starting a task which leads to him ?hitting issues and giving up;? and can follow proper sequence of tasks but indicated it requires frequent re-reading of the directions and checking his work as he forgets the small details. He also discussed a history of depressive episodes; generalized anxiety; social anxiety-related concerns that he described as secondary to it being ?hard to follow rules of social interactions? and making small talk, worry he will say or do things that are not socially acceptable, and it appearing more difficult for him to interact socially than others unless in familiar settings he finds interesting, and overeating-related behaviors. He described his ADHD-related concerns as independent of mood and ?constant,? although noted they become exacerbated during periods of stress and low mood. He stated his coping strategies include utilizing ?lots of reminders? and calendars, writing down information, keeping things organized and putting certain things in a specific place, use of cruise control, being mindful of maladaptive urges, reminding himself he may not need or know how beneficial a purchase would be, double checking his work, and listening to podcasts or YouTube videos while sleeping.  ? ?Mr. Artist denied awareness of experiencing any developmental milestone delays. He  described himself as outgoing during early childhood but after the seventh grade he became ?more reserved? and anxious in social situations. He discussed he was generally a ?smart kid,? received ?straight As

## 2021-10-25 ENCOUNTER — Ambulatory Visit: Payer: BC Managed Care – PPO | Admitting: Psychology

## 2021-10-25 DIAGNOSIS — F89 Unspecified disorder of psychological development: Secondary | ICD-10-CM

## 2021-10-25 NOTE — Progress Notes (Signed)
Date: 10/25/2021   Appointment Start Time: 9am Duration: 65 minutes Provider: Clarice Pole, PsyD Type of Session: Testing Appointment for Evaluation  Location of Patient: Home Location of Provider: Provider's Home (private office) Type of Contact: WebEx video visit with audio  Session Content: Today's appointment was a telepsychological visit due to COVID-19. Javarian is aware it is his responsibility to secure confidentiality on his end of the session. Prior to proceeding with today's appointment, Macio's physical location at the time of this appointment was obtained as well a phone number he could be reached at in the event of technical difficulties. Alphonsa denied anyone else being present in the room or on the virtual appointment. This provider reviewed that video should not be captured, photos should not be taken, nor should testing stimuli be copied or recorded as it would be a copyright violation. Trevel expressed understanding of the aforementioned, and verbally consented to proceed. The WAIS-IV was administered, scored, and interpreted by this evaluator  Billing codes will be input on the feedback appointment. There are no billing codes for the testing appointment.   Provisional DSM-5 diagnosis(es):  F89 Unspecified Disorder of Psychological Development   Plan: Tyray was scheduled for a feedback appointment on 11/02/2021 at 11am via WebEx video visit with audio.                Dolores Lory, PsyD

## 2021-11-01 NOTE — Progress Notes (Signed)
BH MD/PA/NP OP Progress Note  11/08/2021 8:36 AM Craig Andrade  MRN:  [id number]  Chief Complaint:  Chief Complaint  Patient presents with   Follow-up   HPI:  This is a follow-up appointment for depression and ADHD.  -Reviewed neuropsychological testing in May 2023, [facility].  The test was consistent with ADHD.  He states that he feels good to find a cause, and feels sad that he has this condition.  He stopped atomoxetine about a month ago to be prepared for neuropsychological testing.  He also states that the medication did not work as much/did not notice much difference even when he takes medication after he has been on it for a few weeks.  He is willing to try stimulant as he tried those when he was a child.  He finds it difficult to do tasks now that he is more busy with more tasks at work.  He denies any issues with rock climbing.  He enjoys doing it with his group of friends.  Although he sleeps well, he occasionally feels fatigued in the morning.  He was once told he had snoring.  He denies change in appetite.  He denies feeling depressed or anxiety.  He denies SI.  He feels comfortable to stay on venlafaxine.    Wt Readings from Last 3 Encounters:  11/08/21 200 lb 12.8 oz (91.1 kg)  05/08/21 212 lb (96.2 kg)  11/20/20 199 lb 6.4 oz (90.4 kg)     Visit Diagnosis:    ICD-10-CM   1. Attention deficit hyperactivity disorder (ADHD), combined type  F90.2 methylphenidate (RITALIN) 10 MG tablet    2. MDD (major depressive disorder), recurrent, in full remission (HCC)  F33.42       Past Psychiatric History: Please see initial evaluation for full details. I have reviewed the history. No updates at this time.     Past Medical History: History reviewed. No pertinent past medical history. History reviewed. No pertinent surgical history.  Family Psychiatric History: Please see initial evaluation for full details. I have reviewed the history. No updates at this time.      Family History:  Family History  Problem Relation Age of Onset   Depression Mother    Healthy Mother    Healthy Father    Anxiety disorder Brother     Social History:  Social History   Socioeconomic History   Marital status: Single    Spouse name: Not on file   Number of children: Not on file   Years of education: Not on file   Highest education level: Not on file  Occupational History   Not on file  Tobacco Use   Smoking status: Never   Smokeless tobacco: Never  Vaping Use   Vaping Use: Never used  Substance and Sexual Activity   Alcohol use: Yes    Alcohol/week: 1.0 standard drink    Types: 1 Glasses of wine per week    Comment: drinks no more than 2 beers on occassion.    Drug use: Never    Comment: No illicit drug use   Sexual activity: Not Currently  Other Topics Concern   Not on file  Social History Narrative   Not on file   Social Determinants of Health   Financial Resource Strain: Low Risk    Difficulty of Paying Living Expenses: Not hard at all  Food Insecurity: No Food Insecurity   Worried About Running Out of Food in the Last Year: Never true  Ran Out of Food in the Last Year: Never true  Transportation Needs: No Transportation Needs   Lack of Transportation (Medical): No   Lack of Transportation (Non-Medical): No  Physical Activity: Sufficiently Active   Days of Exercise per Week: 5 days   Minutes of Exercise per Session: 60 min  Stress: Stress Concern Present   Feeling of Stress : To some extent  Social Connections: Socially Isolated   Frequency of Communication with Friends and Family: Once a week   Frequency of Social Gatherings with Friends and Family: Three times a week   Attends Religious Services: Never   Active Member of Clubs or Organizations: No   Attends Banker Meetings: Never   Marital Status: Never married    Allergies: No Known Allergies  Metabolic Disorder Labs: No results found for: HGBA1C, MPG No  results found for: PROLACTIN Lab Results  Component Value Date   CHOL 194 05/16/2021   TRIG 65.0 05/16/2021   HDL 49.80 05/16/2021   CHOLHDL 4 05/16/2021   VLDL 13.0 05/16/2021   LDLCALC 131 (H) 05/16/2021   LDLCALC 122 (H) 02/07/2020   Lab Results  Component Value Date   TSH 2.31 02/04/2019    Therapeutic Level Labs: No results found for: LITHIUM No results found for: VALPROATE No components found for:  CBMZ  Current Medications: Current Outpatient Medications  Medication Sig Dispense Refill   atomoxetine (STRATTERA) 40 MG capsule Take 1 capsule (40 mg total) by mouth daily. 30 capsule 0   methylphenidate (RITALIN) 10 MG tablet Take 1 tablet (10 mg total) by mouth 2 (two) times daily. 60 tablet 0   [START ON 12/08/2021] methylphenidate (RITALIN) 10 MG tablet Take 1 tablet (10 mg total) by mouth 2 (two) times daily. 60 tablet 0   venlafaxine XR (EFFEXOR-XR) 150 MG 24 hr capsule Take 1 capsule (150 mg total) by mouth daily. 90 capsule 0   No current facility-administered medications for this visit.     Musculoskeletal: Strength & Muscle Tone: within normal limits Gait & Station: normal Patient leans: N/A  Psychiatric Specialty Exam: Review of Systems  Psychiatric/Behavioral:  Positive for decreased concentration. Negative for agitation, behavioral problems, confusion, dysphoric mood, hallucinations, self-injury, sleep disturbance and suicidal ideas. The patient is not nervous/anxious and is not hyperactive.   All other systems reviewed and are negative.  Blood pressure 123/80, pulse 76, temperature 98.3 F (36.8 C), temperature source Oral, weight 200 lb 12.8 oz (91.1 kg).Body mass index is 28.01 kg/m.  General Appearance: Casual  Eye Contact:  Good  Speech:  Clear and Coherent  Volume:  Normal  Mood:   good  Affect:  Appropriate, Congruent, and Full Range  Thought Process:  Coherent  Orientation:  Full (Time, Place, and Person)  Thought Content: Logical   Suicidal  Thoughts:  No  Homicidal Thoughts:  No  Memory:  Immediate;   Good  Judgement:  Good  Insight:  Good  Psychomotor Activity:  Normal  Concentration:  Concentration: Good and Attention Span: Good  Recall:  Good  Fund of Knowledge: Good  Language: Good  Akathisia:  No  Handed:  Right  AIMS (if indicated): not done  Assets:  Communication Skills Desire for Improvement  ADL's:  Intact  Cognition: WNL  Sleep:  Good   Screenings: GAD-7    Advertising copywriter from 07/17/2021 in [facility] Office Visit from 05/08/2021 in [facility] Office Visit from 11/20/2020 in [facility] [employer] Video  Visit from 10/05/2020 in [facility] Office Visit from 02/07/2020 in [facility]  Total GAD-7 Score 6 2 2 16 2       PHQ2-9    Flowsheet Row Office Visit from 11/08/2021 in [facility] Most recent reading at 11/08/2021  8:05 AM Video Visit from 07/17/2021 in [facility] Most recent reading at 07/17/2021  4:19 PM Counselor from 07/17/2021 in [facility] Most recent reading at 07/17/2021 10:07 AM Office Visit from 05/08/2021 in [facility] Most recent reading at 05/08/2021  3:36 PM Office Visit from 11/20/2020 in [facility] Most recent reading at 11/20/2020  8:30 AM  PHQ-2 Total Score 0 1 0 0 0  PHQ-9 Total Score 3 -- -- 5 1      Flowsheet Row Video Visit from 07/17/2021 in [facility] Most recent reading at 07/17/2021  4:19 PM Counselor from 07/17/2021 in [facility] Most recent reading at 07/17/2021 10:07 AM  C-SSRS RISK CATEGORY No Risk No Risk        Assessment and Plan:  NAVDEEP HALT is a 31 y.o. year old male with a history of depression, anxiety, who presents for follow up appointment for below.   1.  Attention deficit hyperactivity disorder (ADHD), combined type Neuropsychological testing was consistent with ADHD.  He reports limited benefit from atomoxetine.  He is willing to try a stimulant again.  Will start rexulti in to target ADHD.  I discussed potential risk of headache, insomnia, palpitation, hypertension and an appetite loss.   2. MDD (major depressive disorder), recurrent, in full remission (HCC) He denies any significant mood symptoms except occasional fatigue since the last visit.  Will continue venlafaxine as maintenance treatment for depression.    Plan Discontinue atomoxetine Start Ritalin 10 mg twice a day Continue venlafaxine 150 mg daily  Next appointment: 7/13 at 1 PM, in person, video [email or web address]    Past trials of medication: lexapro, fluoxetine, venlafaxine, bupropion, Ritalin (felt spacy), Concerta, Focalin, Adderall, Vyvanse, Strattera (he had some side effect from stimulant, including nausea.)   The patient demonstrates the following risk factors for suicide: Chronic risk factors for suicide include: psychiatric disorder of depression . Acute risk factors for suicide include: N/A. Protective factors for this patient include: coping skills and hope for the future. Considering these factors, the overall suicide risk at this point appears to be low. Patient is appropriate for outpatient follow up.        Collaboration of Care: Collaboration of Care: Other reviewed record from psychologist  Patient/Guardian was advised Release of Information must be obtained prior to any record release in order to collaborate their care with an outside provider. Patient/Guardian was advised if they have not already done so to contact the registration department to sign all necessary forms in order for 8/13 to release information regarding their care.   Consent: Patient/Guardian gives verbal consent for treatment and assignment of benefits for services provided during this visit.  Patient/Guardian expressed understanding and agreed to proceed.    [place], MD 11/08/2021, 8:36 AM

## 2021-11-02 ENCOUNTER — Ambulatory Visit (INDEPENDENT_AMBULATORY_CARE_PROVIDER_SITE_OTHER): Payer: BC Managed Care – PPO | Admitting: Psychology

## 2021-11-02 DIAGNOSIS — F902 Attention-deficit hyperactivity disorder, combined type: Secondary | ICD-10-CM

## 2021-11-02 NOTE — Progress Notes (Signed)
Testing and Report Writing Information: The following measures  were administered, scored, and interpreted by this provider:  Generalized Anxiety Disorder-7 (GAD-7; 5 minutes), Patient Health Questionnaire-9 (PHQ-9; 5 minutes), Wechsler Adult Intelligence Scale-Fourth Edition (WAIS-IV; 60 minutes), CNS Vital Signs (45 minutes), Adult Attention Deficit/Hyperactivity Disorder Self-Report Scale Checklist (ASRSv1.1; 15 minutes), Behavior Rating Inventory for Executive Function - A - Self Report (Brief- A; 10 minutes), Personality Assessment Inventory (PAI; 50 minutes). A total of 190 minutes was spent on the administration of the scoring of the aforementioned measures and codes 96136 and 96137 (5 units) were billed.  Please see the assessment for additional details. This provider completed the written report which includes integration of patient data, interpretation of standardized test results, interpretation of clinical data, review of information provided by Alroy Dust and any collateral information/documentation, and clinical decision making (220 minutes in total).  Feedback Appointment: Date: 11/02/2021 Appointment Start Time: 10am Duration: 55 minutes Provider: Clarice Pole, PsyD Type of Session: Feedback Appointment for Evaluation  Location of Patient: Home Location of Provider: Provider's Home (private office) Type of Contact: Webex video visit with audio  Session Content: Today's appointment was a telepsychological visit due to COVID-19. Leamon is aware it is his responsibility to secure confidentiality on his end of the session. He provided verbal consent to proceed with today's appointment. Prior to proceeding with today's appointment, [place]'s physical location at the time of this appointment was obtained as well a phone number he could be reached at in the event of technical difficulties. Brach denied anyone else being present in the room or on the virtual appointment.  This provider and  Alroy Dust completed the interactive feedback session which includes reviewing the following measures: Generalized Anxiety Disorder-7 (GAD-7), Patient Health Questionnaire-9 (PHQ-9), Wechsler Adult Intelligence Scale-Fourth Edition (WAIS-IV), CNS Vital Signs, Adult Attention Deficit/Hyperactivity Disorder Self-Report Scale Checklist (ASRSv1.1), Behavior Rating Inventory for Executive Function - A - Self Report (Brief- A), Personality Assessment Inventory (PAI). During this interactive feedback session, results of the aforementioned measures, treatment recommendations, and diagnostic conclusions were discussed.   The interactive feedback session was completed today and a total of 55 minutes was spent on feedback. Code 27062 was billed for feedback session.   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 10/15/2021: 5:30-5:55pm. 10/16/2021: 3:50-4:35pm. 10/19/2021: 6:45-6:55pm. 10/22/2021: 7:35-8:05pm. 10/25/2021: 11:15-11:35am and 2:20-2:50pm. 10/29/2021: 11:40-12:20pm and 4:35-4:50pm. 11/02/2021: 10:55-11am. (billing code [phone] [4 units])  Plan: Alroy Dust provided verbal consent for his evaluation to be sent via e-mail. No further follow-up planned by this provider.        CONFIDENTIAL PSYCHOLOGICAL EVALUATION ______________________________________________________________________________  Name: Craig Andrade   Date of Birth: 10-Nov-1990    Age: 69 Dates of Evaluation: 10/12/2021, 10/16/2021, and 10/25/2021  SOURCE AND REASON FOR REFERRAL: Mr. Nagee Goates was referred by Dr. Norman Clay for an evaluation to ascertain if he meets criteria for Attention Deficit/Hyperactivity Disorder (ADHD).   EVALUATIVE PROCEDURES: Clinical Interview with Mr. Donell Tomkins  (10/12/2021) Wechsler Adult Intelligence Scale-Fourth Edition (WAIS-IV; 10/25/2021) CNS Vital Signs (10/16/2021) Adult Attention Deficit/Hyperactivity Disorder Self-Report Scale Checklist (10/16/2021) Behavior Rating Inventory for Executive Function - A - Self Report (BRIEF-A; 10/16/2021) Personality Assessment Inventory (PAI; 10/16/2021) Patient Health Questionnaire-9 (PHQ-9) Generalized Anxiety Disorder-7 (GAD-7)   BACKGROUND INFORMATION AND PRESENTING PROBLEM: Mr. Maury Groninger is a 31 year old male who resides in [place], [place]  Mr. Cordner reported he was diagnosed with ADHD and prescribed various medications (e.g., Focalin, Strattera, and Ritalin) throughout childhood and college, stating he "tried pretty much every [medication] in the book." He further reported they were generally helpful, but some caused him to become sedated, "spacey," or significantly reduced his appetite. He indicated he is pursuing an ADHD evaluation as he has been unable to get his records from his PCP and psychiatrist from the time of a previous evaluation. He described his ADHD-related concerns as including alternating between concentration and attention shifting impairment (e.g., forgets tasks he was working on after switching to another task) and hyperfixation on tasks; experiencing irritability when he is interrupted as it can be a "struggle" to get his attention back on tasks and/or the interruption may include another task he has to "track;" difficulty listening when others are speaking to him; feeling restless when doing passive tasks (e.g., phone calls and watching TV) and regularly doing something "more stimulating" (e.g., Reddit or video games) when engaged in them; problems maintaining organizational systems; planning difficulties that result in him "double scheduling things;" commonly being forgetful (e.g., paying bills, misplacing items, problems remembering tasks he was doing, and leaving cupboards  open); regularly fidgeting and squirming (e.g., "picking [his] fingers and cuticles," "tapping," and humming or singing to himself); a belief he talks too much because he commonly "rambles" or switches topics while speaking; task initiation impairment that results in him relying on a sense of urgency to motivate him to start a task, occasional task maintenance issues, and task disengagement problems as he does not like "leaving something partly done;" occasionally interrupts others as he does not want to forget what he wants to say; waiting causes him to feel "antsy" and he is told by others he struggles with patience; has a natural inclination to drive much faster than others which has resulted in a speeding ticket for going 60mh over the speed limit and becoming upset when someone is "going slow" in front of him; impulsive shopping decisions while in grocery stores;  "mind racing" that contributes to sleep onset issues as well as regularly tossing and turning in his sleep; usually does not read or fully understand directions before starting a task which leads to him "hitting issues and giving up;" and can follow proper sequence of tasks but indicated it requires frequent re-reading of the directions and checking his work as he forgets the small details. He also discussed a history of depressive episodes; generalized anxiety; social anxiety-related concerns he described as secondary to it being "hard to follow rules of social interactions" and making small talk, worry he will say or do things that are not socially acceptable, and it appears more difficult for him to interact socially with others unless in familiar settings he finds interesting; and overeating-related behaviors. He described his ADHD-related concerns as independent of mood and "constant," although noted they become exacerbated during periods of stress and low mood. He stated his coping strategies include utilizing "lots of reminders" and calendars,  writing down information, keeping things organized and putting certain things in a specific place, use of cruise control, being mindful of maladaptive urges, reminding himself he may not need or know how beneficial a purchase would be, double checking his work, and listening to podcasts or YouTube videos while sleeping.   Mr. RMalechadenied awareness of experiencing any developmental milestone delays. He described himself as outgoing during early childhood but after the seventh grade he became "more reserved" and anxious in social  situations. He discussed he was generally a "smart kid," received "straight As," and had natural academic abilities, but concentration, forgetfulness, and procrastination "got in the way." He stated he generally got along with his teachers, but some became irritable with his "disruptive behaviors." He shared he obtained a master's degree in Public relations account executive and "loved it," although added he "burnt out keeping up with all the work" as it often took him longer to complete tasks than others. 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  Mr. Doughten described his medical history as unremarkable and denied ever experiencing a head injury or seizure. He discussed a history of seeing various "counselors, therapists, and psychiatrists" since childhood which were generally helpful, with the last instance being two years ago. He reported use of two standard size drinks approximately every two weeks as well as daily use of a 256m energy drink and 12oz of coffee. He denied ever experiencing obsessions and compulsions; abuse or neglect; self-harm; suicidal or homicidal ideation, plan, or intent; or legal involvement.   BEHAVIORAL OBSERVATIONS: Mr. RJanuszewskipresented on time for the evaluation. He was well-groomed. He was oriented to time,  place, person, and purpose of the appointment. During the evaluation he verbalized and demonstrated infrequent word finding issues (e.g., "I am blanking on the grammatical word. DMina Marbleit."), frustration with perceived difficulties (e.g., saying "Oh crap," facial grimacing, and shaking legs), and forgetting numbers he was holding in short-term memory when becoming concerned he may not be allowed to use his fingers as a visual for numbers being verbally provided to him. Throughout the course of the evaluation, he maintained appropriate eye contact. His thought processes and content were logical, coherent, and goal-directed. There were no overt signs of a thought disorder or perceptual disturbances, nor did she report such symptomatology. There was no evidence of paraphasias (i.e., errors in speech, gross mispronunciations, and word substitutions), repetition deficits, or disturbances in volume or prosody (i.e., rhythm and intonation). Overall, based on Mr. REveryapproach to testing, the current results are believed to be a good estimate of his abilities.  PROCEDURAL CONSIDERATIONS:  Psychological testing measures were conducted through a virtual visit with video and audio capabilities, but otherwise in a standard manner.   The Wechsler Adult Intelligence Scale, Fourth Edition (WAIS-IV) was administered via remote telepractice using digital stimulus materials on [employer]'s Q-global system. The remote testing environment appeared free of distractions, adequate rapport was established with the examinee via video/audio capabilities, and Mr. RKeadleappeared appropriately engaged in the task throughout the session. No significant technological problems or distractions were noted during administration. Modifications to the standardization procedure included: none. The WAIS-IV subtests, or similar tasks, have received initial validation in several samples for remote telepractice and digital format administration, and the  results are considered a valid description of Mr. RArdolinoskills and abilities.  CLINICAL FINDINGS:  COGNITIVE FUNCTIONING  Wechsler Adult Intelligence Scale, Fourth Edition (WAIS-IV):  Mr. RLambacompleted subtests of the WAIS-IV, a full-scale measure of cognitive ability. He completed subtests of the WAIS-IV, a full-scale measure of cognitive ability. The WAIS-IV is comprised of four indices that measure cognitive processes that are components of intellectual ability; however, only subtests from the Verbal Comprehension and Working Memory indices were administered. As a result, Full-Scale-IQ (FSIQ) and General Ability Index (GAI) were unable to be determined. He presents with similar abilities.  WAIS-IV Scale/Subtest IQ/Scaled Score 95% Confidence Interval Percentile Rank Qualitative Description  Verbal Comprehension (VCI) 138 131-142 99 Very Superior  Similarities 16     Vocabulary 19     Information 14     Working Memory (WMI) 128 120-133 97 Superior  Digit Span 15     Arithmetic 15       The Verbal Comprehension Index (VCI) provides a measure of one's ability to receive, comprehend, and express language. It also measures the ability to retrieve previously learned information and to understand relationships between words and concepts presented orally. Mr. Doucet obtained a VCI scaled score of 138 (99th percentile) placing him in the very superior range compared to same-aged peers. His performance on the subtests comprising this index was diverse. Out of the three subtests, Mr. Dooling demonstrated the strongest performance on the Vocabulary subtest, an assessment of word knowledge that also requires ability to verbalize meaningful concepts as well as to retrieve information from long-term memory. His lowest performance was on the Information subtests, which is primarily a measure of his fund of general knowledge although also may be influenced by cultural experience, quality of education, and  ability to retrieve information from long-term memory.   The Working Memory Index ([place]) provides a measure of one's ability to sustain attention, concentrate, and exert mental control. Mr. Gervasi obtained a WMI scaled score of 128 (97th percentile), placing him in the superior range compared to same-aged peers; however, his WMI score was significantly lower than his VCI score, which may be indicative of his WMI abilities, as measured by the WAIS-IV, being a relative weakness. Mr. Wainer demonstrated similar performance on the subtests comprising this index.  ATTENTION AND PROCESSING  CNS Vital Signs: The CNS Vital Signs assessment evaluates the neurocognitive status of an individual and covers a range of mental processes. The results of the CNS Vital Signs testing indicated average neurocognitive processing ability. His attentional abilities ranged from average-above. Cognitive flexibility and executive functioning were above. Working memory was average.  Psychomotor speed and motor speed were above, and processing speed and reaction time were average, which indicates average-above hand-eye coordination, thinking speed, and responsiveness. Visual memory (images) was low average and verbal memory (words) was very low, which indicates verbal memory is impaired. While his visual memory is a relative strength when compared to verbal memory, it is a relative weakness when compared to his overall neurocognitive processing ability. The results suggest Mr. Lepage experiences verbal memory impairment; a relative weakness in visual memory; and strengths in psychomotor speed, motor speed, complex attention, cognitive flexibility, and executive functioning.   Domain  Standard Score Percentile Validity Indicator Guideline  Neurocognitive Index 107 68 Yes Average  Composite Memory 69 2 Yes Very Low  Verbal Memory 64 1 Yes Very Low  Visual Memory 82 12 Yes Low Average  Psychomotor Speed 125 95 Yes Above  Reaction  Time 109 73 Yes Average  Complex Attention 113 81 Yes Above  Cognitive Flexibility 118 88 Yes Above  Processing Speed  106 66 Yes Average  Executive Function 117 87 Yes Above  Working Memory 102 55 Yes Average  Sustained Attention 103 58 Yes Average  Simple Attention 108 70 Yes Average  Motor Speed 130 98 Yes Above   EXECUTIVE FUNCTION  Behavior Rating Inventory of Executive Function, Second Edition (BRIEF-A):  Mr. Marsiglia completed the Self-Report Form of the Behavior Rating Inventory of Executive Function-Adult Version (BRIEF-A), which has three domains that evaluate cognitive, behavioral, and emotional regulation, and  a Magazine features editor Composite score provides an overall snapshot of executive functioning. There are no missing item responses in the protocol.  The Negativity, Infrequency, and Inconsistency scales are not elevated, suggesting he did not respond to the protocol in an overly negative, haphazard, extreme, or inconsistent manner. In the context of these validity considerations, ratings of Mr. Paget everyday executive function suggest some areas of concern. The overall index, the Global Executive Composite (GEC), was elevated (GEC T = 79, %ile = >99). Both the Behavioral Regulation (BRI) and the Metacognition (MI) Indexes were elevated (BRI T = 69, %ile = 95 and MI T = 82, %ile = >99).  Mr. Corsino indicated difficultly with his ability to inhibit impulsive responses, adjust to changes in routine or task demands, initiate problem solving or activity, sustain working memory, plan and organize problem-solving approaches, attend to task-oriented output, and organize environment and materials. He did not describe his ability to modulate emotions and monitor social behavior as problematic. Mr. Wible's elevated scores on the Inhibit scale as well as the Behavioral Regulation and the Metacognition Indexes, suggest he is perceived as having poor inhibitory control and/or suggest more global  behavioral dysregulation is having a negative effect on active metacognitive problem solving.  Scale/Index  Raw Score T Score Percentile  Inhibit 19 74 98  Shift 16 81 >99  Emotional Control 19 58 76  Self-Monitor 10 54 70  Behavioral Regulation Index (BRI) 64 69 95  Initiate 20 76 98  Working Memory 23 89 >99  Plan/Organize 27 84 >99  Task Monitor 13 68 98  Organization of Materials 20 69 97  Metacognition Index (MI) 103 82 >99  Global Executive Composite (GEC) 167 79 >99   Validity Scale Raw Score Cumulative Percentile Protocol Classification  Negativity 4 0 - 98.3 Acceptable  Infrequency 0 0 - 97.3 Acceptable  Inconsistency 2 0 - 99.2 Acceptable   BEHAVIORAL FUNCTIONING   Patient Health Questionnaire-9 (PHQ-9): Mr. Hearty completed the PHQ-9, a self-report measure that assesses symptoms of depression. He scored 3/27, which indicates minimal depression.   Generalized Anxiety Disorder- 7 (GAD-7): Mr. Prashad completed the GAD-7, a self-report measure that assesses symptoms of anxiety. He scored 4/21, which indicates minimal anxiety.   Adult ADHD Self-Report Scale Symptom Checklist (ASRS): Mr. Dennin reported the following symptoms as sometimes: difficulty relaxing and interrupting others when they are busy. He endorsed the following symptoms as occurring often: problems remembering appointments or obligations, making careless mistakes when working on boring or difficult projects, being distracted by noise around her, feeling restless or fidgety, talking too much in social situations, and difficulty waiting for turn in turn taking situations. He endorsed the following symptoms as very often: difficulty wrapping up final details of a project following the completion of challenging aspects, difficulty getting things in order when a task requires organization, avoiding or delaying getting started on tasks requiring a lot of thought, fidgeting or squirming, struggling to sustain attention when  doing boring or repetitive work, struggling to concentrate on what people say to them even when they are speaking directly to them, misplacing or has difficulty finding things, and interrupting others or finishing their sentences. Endorsement of at least four items in Part A is highly consistent with ADHD in adults. The frequency scores of Part B provides additional cues. Mr. Mosqueda scored a 5/6 on Part A and 10/12 on Part B, which is considered a positive screening for ADHD.  Personality Assessment Inventory (PAI): The PAI is an objective inventory  of adult personality. The validity indicators suggested Mr. Martino profile is interpretable (ICN T = 40, INF T = 40, NIM T = 44, and PIM T = 31). He is endorsing health issues that may be having an impact on his daily functioning (SOM-H T = 80); significant anxiety and distress (ANX-T = 76 and DEP-A T = 72) that involves difficulty relaxing and being easily fatigued (ANX-A T = 81), impatience and irritability (MAN-I T = 67), ruminative worry and intrusive thoughts that may impair attention and involve doubts about his ability to control his impulses (ANX-C T = 78, DEP-C T = 61, and ARD-O T = 78), and phobic behaviors that may involve social detachment in an effort to avoid feared social situations in which difficulties with self-expression and communication may cause discomfort (ARD-O T = 78, PAR-H T = 60, SCZ-S T = 74, SCZ-T T = 90, DOM T = 31, and ARD-P T = 73) although these efforts are likely not adequate in controlling marked anxiety (ARD-O T = 78); and uncertainty about major life issues and problems maintaining a sense of purpose (BOR-I T = 77).     SUMMARY AND CLINICAL IMPRESSIONS: Mr. Emmit Oriley is a 31 year old male who was referred by Dr. Norman Clay for an evaluation to determine if he currently meets criteria for a diagnosis of Attention-Deficit/Hyperactivity Disorder (ADHD).   Mr. Power endorsed having been previously diagnosed with ADHD and  prescribed various medications throughout childhood and college, noting the medications were generally helpful but some caused him to become sedated, "spacey," or significantly reduced his appetite. He indicated he is pursuing an ADHD evaluation as he has been unable to get his records from a past evaluation. He also discussed a history of depressive episodes; generalized anxiety; overeating-related behaviors; and social anxiety-related concerns that he described as stemming from it being "hard to follow rules of social interactions," difficulty "making small talk," worry he will say or do things that are not socially acceptable, and a belief it is more difficult for him to interact socially than others unless he is in a familiar setting he finds interesting. He described his ADHD-related concerns as independent of mood and "constant," although noted they become exacerbated during periods of stress and low mood.  During the evaluation, Mr. Brickell was administered assessments to measure his current cognitive abilities. His verbal comprehension abilities were in the very superior average range, and he strongest performance on the Vocabulary subtest, an assessment of word knowledge that also requires ability to verbalize meaningful concepts as well as to retrieve information from long-term memory. His lowest performance was on the Information subtests, which is primarily a measure of his fund of general knowledge although also may be influenced by cultural experience, quality of education, and ability to retrieve information from long-term memory. His ability to sustain attention, concentrate, and exert mental control was in the superior range compared to same-aged peers; however, his WMI score was statistically significantly lower than his VCI score, which suggests a relative weakness in attention, concentration, mental control, and shorter-term auditory memory as well as in arithmetic computational skills. Results  of the CNS Vital Signs indicated an average neurocognitive processing ability with impairment in verbal memory, a relative weakness in visual memory, and strengths in psychomotor speed, motor speed, complex attention, cognitive flexibility, and executive functioning.  During the clinical interview and on self-report measures, Mr. Cortese endorsed executive functioning and concentration impairment, hyperactivity-related symptoms, and meeting full criteria for ADHD. 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 The specifier of "Moderate" was assigned as he endorsed symptoms in excess of what is needed to make the diagnosis and indicated they cause marked impairment in academic, social, and daily functioning. Mr. Boeke also indicated a history of depressive episodes; generalized anxiety; social anxiety-related concerns; and experiencing social deficits when outside of familiar settings; however, given the limited scope of this evaluation, it was unable to be determined if full criteria for these disorders are met or if the symptoms are better explained by his diagnosis of ADHD. He would likely benefit from further evaluation of these symptoms to definitively rule in or out generalized anxiety disorder, major depressive disorder, social anxiety disorder, or autism spectrum disorder. Should any of the aforementioned be ruled in, they would likely be in addition to his diagnosis of ADHD as he indicated ADHD-related concerns are independent of mood and his attentional and hyperactivity-related difficulties exceed that typically seen in individuals of a comparable age.   DSM-5 Diagnostic  Impressions: F90.2 Attention-Deficit/Hyperactivity Disorder, Combined Presentation, Moderate  RECOMMENDATIONS: Mr. Shafer would likely benefit from making use of strategies for ADHD symptoms:  Setting a timer to complete tasks. Breaking tasks into manageable chunks and spreading them out over longer periods of time with breaks.  Utilizing lists and day calendars to keep track of tasks.  Answering emails daily.  Improving listening skills by asking the speaker to give information in smaller chunks and asking for explanation for clarification as needed. Leaving more than the anticipated time to complete tasks. It may help to keep tasks brief, well within your attention span, and a mix of both high and low interest tasks. Tasks may be gradually increased in length. Practice proactive planning by setting aside time every evening to plan for the next day (e.g., prepare needed materials or pack the car the night before).  Learn how to make an effective and reasonable "to do" list of important tasks and priorities and always keep it easily accessible. Make additional copies in case it is lost or misplaced. Utilize visual reminders by posting appointments, "to do lists," or schedule in strategic areas at home and at work.  Practice using an appointment book, smart phone or other tech device, or a daily planning calendar, and learn to write down appointments and commitments immediately. Keep notepads or use a portable audio recorder to capture important ideas that would be beneficial to recall later. Learn and practice time management skills. Purchase a programmable alarm watch or set an alarm on smartphone to avoid losing track of time.  Use a color-coded file system, desk and closet organizers, storage boxes, or other organization devices to reduce clutter and improve efficiency and structure.  Implement ways to become more aware of your actions and to inhibit or adjust them as warranted (e.g., reviewing  videos of your actions, consider consequences of obeying or not obeying the rules of various upcoming situations, have a trusted other to discuss plans with and/or provide cues to stop certain behaviors, and make visual cues for rules you would like to follow). Stay flexible and be prepared to change your plans as symptom breakthroughs and crises are likely to occur periodically. Mr. Flott may benefit from neurofeedback and mindfulness training to address symptoms of inattention.  Mr. Bulman would likely benefit  from a consultation regarding medication for ADHD symptoms.   Individual therapeutic services may assist in improving coping skills for ADHD-related symptoms. Mental alertness/energy can be raised by increasing exercise; improving sleep; eating a healthy diet; and managing stress. Consult with a physician regarding any changes to physical regimen is recommended. "Failing at Normal: An ADHD Success Story" by Mellody Drown is a great overview of ADHD.  Organizations that are a good source of information on ADHD:  Children and Adults with Attention-Deficit/Hyperactivity Disorder (CHADD): chadd.org  Attention Deficit Disorder Association (ADDA): CondoFactory.com.cy ADD Resources: addresources.org ADD WareHouse: addwarehouse.com World Federation of ADHD: adhd-federation.org ADDConsults: https://www.hines.net/. Future evaluation if deemed necessary and/or to determine effectiveness of recommended interventions.   Clarice Pole, Psy.D. Licensed Psychologist - [facility] #[id number]            Dolores Lory, PsyD

## 2021-11-08 ENCOUNTER — Encounter: Payer: Self-pay | Admitting: Psychiatry

## 2021-11-08 ENCOUNTER — Ambulatory Visit (INDEPENDENT_AMBULATORY_CARE_PROVIDER_SITE_OTHER): Payer: BC Managed Care – PPO | Admitting: Psychiatry

## 2021-11-08 VITALS — BP 123/80 | HR 76 | Temp 98.3°F | Wt 200.8 lb

## 2021-11-08 DIAGNOSIS — F902 Attention-deficit hyperactivity disorder, combined type: Secondary | ICD-10-CM

## 2021-11-08 DIAGNOSIS — F3342 Major depressive disorder, recurrent, in full remission: Secondary | ICD-10-CM | POA: Diagnosis not present

## 2021-11-08 MED ORDER — METHYLPHENIDATE HCL 10 MG PO TABS: 10.0000 mg | ORAL_TABLET | Freq: Two times a day (BID) | ORAL | 0 refills | Status: DC

## 2021-11-08 NOTE — Patient Instructions (Signed)
Discontinue atomoxetine Start Ritalin 10 mg twice a day Continue venlafaxine 150 mg daily  Next appointment: 7/13 at 1 PM

## 2021-11-12 ENCOUNTER — Telehealth: Payer: Self-pay

## 2021-11-12 NOTE — Telephone Encounter (Signed)
went online and submitted the prior auth on the methylphenidate.  - pending.

## 2021-11-12 NOTE — Telephone Encounter (Signed)
received fax that a prior auth was needed for the methylpheniate hcl so I had to do a prior authoriazation

## 2021-11-15 NOTE — Telephone Encounter (Signed)
received fax that methylphenidate hcl 10mg  was approved from  11-14-21 to  11-14-24

## 2021-12-04 ENCOUNTER — Other Ambulatory Visit: Payer: Self-pay | Admitting: Psychiatry

## 2021-12-04 DIAGNOSIS — F3341 Major depressive disorder, recurrent, in partial remission: Secondary | ICD-10-CM

## 2021-12-20 ENCOUNTER — Ambulatory Visit: Payer: BC Managed Care – PPO | Admitting: Psychiatry

## 2021-12-27 ENCOUNTER — Other Ambulatory Visit (HOSPITAL_COMMUNITY)
Admission: RE | Admit: 2021-12-27 | Discharge: 2021-12-27 | Disposition: A | Discharge status: Home / Self Care | Payer: BC Managed Care – PPO | Source: Ambulatory Visit | Attending: Family Medicine | Admitting: Family Medicine

## 2021-12-27 ENCOUNTER — Ambulatory Visit (INDEPENDENT_AMBULATORY_CARE_PROVIDER_SITE_OTHER): Payer: BC Managed Care – PPO | Admitting: Family Medicine

## 2021-12-27 ENCOUNTER — Encounter: Payer: Self-pay | Admitting: Family Medicine

## 2021-12-27 VITALS — BP 120/78 | HR 78 | Temp 97.8°F | Ht 71.0 in | Wt 198.8 lb

## 2021-12-27 DIAGNOSIS — E7800 Pure hypercholesterolemia, unspecified: Secondary | ICD-10-CM

## 2021-12-27 DIAGNOSIS — Z7721 Contact with and (suspected) exposure to potentially hazardous body fluids: Secondary | ICD-10-CM | POA: Diagnosis not present

## 2021-12-27 DIAGNOSIS — Z1159 Encounter for screening for other viral diseases: Secondary | ICD-10-CM | POA: Diagnosis not present

## 2021-12-27 DIAGNOSIS — Z0000 Encounter for general adult medical examination without abnormal findings: Secondary | ICD-10-CM | POA: Insufficient documentation

## 2021-12-27 NOTE — Progress Notes (Signed)
Established Patient Office Visit  Subjective   Patient ID: KYDEN POTASH, male    DOB: 03/20/1991  Age: 31 y.o. MRN: [id number]  Chief Complaint  Patient presents with   Exposure to STD    Patient would like to be tested for STD's not exposure just with new partner.     Exposure to STD Pertinent negatives include no abdominal pain, dysuria, frequency, joint pain, rash or urgency.   here for STD screening.  He is in a new relationship that seems promising like to be screened.  He is having no symptoms.  He denies dysuria discharge rash body aches or joint aches.    Review of Systems  Constitutional: Negative.   HENT: Negative.    Eyes:  Negative for blurred vision, discharge and redness.  Respiratory: Negative.    Cardiovascular: Negative.   Gastrointestinal:  Negative for abdominal pain.  Genitourinary: Negative.  Negative for dysuria, frequency, hematuria and urgency.  Musculoskeletal: Negative.  Negative for joint pain and myalgias.  Skin:  Negative for rash.  Neurological:  Negative for tingling, loss of consciousness and weakness.  Endo/Heme/Allergies:  Negative for polydipsia.      Objective:     BP 120/78 (BP Location: Right Arm, Patient Position: Sitting, Cuff Size: Normal)   Pulse 78   Temp 97.8 F (36.6 C) (Temporal)   Ht 5\' 11"  (1.803 m)   Wt 198 lb 12.8 oz (90.2 kg)   SpO2 97%   BMI 27.73 kg/m    Physical Exam Constitutional:      General: He is not in acute distress.    Appearance: Normal appearance. He is not ill-appearing, toxic-appearing or diaphoretic.  HENT:     Head: Normocephalic and atraumatic.     Right Ear: External ear normal.     Left Ear: External ear normal.  Eyes:     General: No scleral icterus.       Right eye: No discharge.        Left eye: No discharge.     Extraocular Movements: Extraocular movements intact.     Conjunctiva/sclera: Conjunctivae normal.  Pulmonary:     Effort: Pulmonary effort is normal.  Skin:     General: Skin is warm and dry.  Neurological:     Mental Status: He is alert and oriented to person, place, and time.  Psychiatric:        Mood and Affect: Mood normal.        Behavior: Behavior normal.      No results found for any visits on 12/27/21.    The ASCVD Risk score (Arnett DK, et al., 2019) failed to calculate for the following reasons:   The 2019 ASCVD risk score is only valid for ages 72 to 6    Assessment & Plan:   Problem List Items Addressed This Visit       Other   Elevated LDL cholesterol level - Primary   Encounter for hepatitis C screening test for low risk patient   Relevant Orders   Hepatitis C antibody   Exposure to body fluid   Relevant Orders   HIV Antibody (routine testing w rflx)   Urine cytology ancillary only   RPR   Hepatitis C antibody    Return if symptoms worsen or fail to improve.   Information was given on safe sex.  Information also given about preventing high cholesterol and the Mediterranean diet.  Counseled on exercising and eating a heart healthy diet such as the  Mediterranean diet. Mliss Sax, MD

## 2021-12-28 LAB — URINE CYTOLOGY ANCILLARY ONLY
Chlamydia: NEGATIVE
Comment: NEGATIVE
Comment: NORMAL
Neisseria Gonorrhea: NEGATIVE

## 2021-12-29 LAB — RPR: RPR Ser Ql: NONREACTIVE

## 2021-12-29 LAB — HIV ANTIBODY (ROUTINE TESTING W REFLEX): HIV 1&2 Ab, 4th Generation: NONREACTIVE

## 2021-12-29 LAB — HEPATITIS C ANTIBODY: Hepatitis C Ab: NONREACTIVE

## 2022-01-01 NOTE — Progress Notes (Signed)
BH MD/PA/NP OP Progress Note  01/07/2022 8:40 AM Craig Andrade  MRN:  [id number]  Chief Complaint:  Chief Complaint  Patient presents with   Follow-up   HPI:  This is a follow-up appointment for ADHD and depression.  He states that he has been doing well.  Although he thinks that his focus has been better, he still has issues when he tries to work on other project at work.  He has difficulty multitasking.  He thinks he is not as forgetful or misplacing things compared to before.  He has not noticed any side effect from Ritalin.  He enjoys rock climbing.  He is looking forward to do it with his parents.  He is now in their relationship for the past several weeks.  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  He believes he was doing better when he was on fluoxetine.  He was stressed more, and he thinks that is why the medication was switched to venlafaxine.  He would like to take a step 1 at the time, and he is willing to try higher dose of Ritalin first.  He sleeps well.  He denies change in appetite.  He denies feeling depressed or anxious.  He denies SI.   Exercise:rock climbing Support: parents Household: single Marital status: single Number of children: 0  Employment:  Comptroller for three years Education:  [employer]  Last PCP / ongoing medical evaluation:    Visit Diagnosis:    ICD-10-CM   1. Attention deficit hyperactivity disorder (ADHD), combined type  F90.2     2. MDD (major depressive disorder), recurrent, in partial remission (HCC)  F33.41 venlafaxine XR (EFFEXOR-XR) 150 MG 24 hr capsule      Past Psychiatric History: Please see initial evaluation for full details. I have reviewed the history. No updates at this time.     Past Medical History: History reviewed. No pertinent past medical history. History reviewed. No pertinent surgical history.  Family Psychiatric  History: Please see initial evaluation for full details. I have reviewed the history. No updates at this time.     Family History:  Family History  Problem Relation Age of Onset   Depression Mother    Healthy Mother    Healthy Father    Anxiety disorder Brother     Social History:  Social History   Socioeconomic History   Marital status: Single    Spouse name: Not on file   Number of children: Not on file   Years of education: Not on file   Highest education level: Not on file  Occupational History   Not on file  Tobacco Use   Smoking status: Never   Smokeless tobacco: Never  Vaping Use   Vaping Use: Never used  Substance and Sexual Activity   Alcohol use: Yes    Alcohol/week: 1.0 standard drink of alcohol    Types: 1 Glasses of wine per week    Comment: drinks no more than 2 beers on occassion.    Drug use: Never    Comment: No illicit drug use   Sexual activity: Not Currently  Other Topics Concern   Not on file  Social History Narrative   Not on file   Social Determinants of Health   Financial Resource Strain: Low Risk  (07/17/2021)   Overall Financial Resource Strain (CARDIA)    Difficulty of Paying Living Expenses: Not hard at all  Food Insecurity: No Food Insecurity (07/17/2021)   Hunger Vital Sign    Worried About Running Out of Food in the Last Year: Never true    Ran Out of Food in the Last Year: Never true  Transportation Needs: No Transportation Needs (07/17/2021)   PRAPARE - Administrator, Civil Service (Medical): No    Lack of Transportation (Non-Medical): No  Physical Activity: Sufficiently Active (07/17/2021)   Exercise Vital Sign    Days of Exercise per Week: 5 days    Minutes of Exercise per Session: 60 min  Stress: Stress Concern Present (07/17/2021)   [employer] of Occupational Health - Occupational Stress Questionnaire    Feeling of Stress : To some extent  Social Connections: Socially Isolated (07/17/2021)   Social Connection  and Isolation Panel [NHANES]    Frequency of Communication with Friends and Family: Once a week    Frequency of Social Gatherings with Friends and Family: Three times a week    Attends Religious Services: Never    Active Member of Clubs or Organizations: No    Attends Banker Meetings: Never    Marital Status: Never married    Allergies: No Known Allergies  Metabolic Disorder Labs: No results found for: "HGBA1C", "MPG" No results found for: "PROLACTIN" Lab Results  Component Value Date   CHOL 194 05/16/2021   TRIG 65.0 05/16/2021   HDL 49.80 05/16/2021   CHOLHDL 4 05/16/2021   VLDL 13.0 05/16/2021   LDLCALC 131 (H) 05/16/2021   LDLCALC 122 (H) 02/07/2020   Lab Results  Component Value Date   TSH 2.31 02/04/2019    Therapeutic Level Labs: No results found for: "LITHIUM" No results found for: "VALPROATE" No results found for: "CBMZ"  Current Medications: Current Outpatient Medications  Medication Sig Dispense Refill   methylphenidate (RITALIN) 20 MG tablet Take 1 tablet (20 mg total) by mouth daily before breakfast. 30 tablet 0   [START ON 02/06/2022] methylphenidate (RITALIN) 20 MG tablet Take 1 tablet (20 mg total) by mouth daily before breakfast. 30 tablet 0   venlafaxine XR (EFFEXOR-XR) 150 MG 24 hr capsule Take 1 capsule (150 mg total) by mouth daily. 90 capsule 0   No current facility-administered medications for this visit.     Musculoskeletal: Strength & Muscle Tone:  normal Gait & Station: normal Patient leans: N/A  Psychiatric Specialty Exam: Review of Systems  Psychiatric/Behavioral:  Positive for decreased concentration. Negative for agitation, behavioral problems, confusion, dysphoric mood, hallucinations, self-injury, sleep disturbance and suicidal ideas. The patient is not nervous/anxious and is not hyperactive.   All other systems reviewed and are negative.   Blood pressure 117/72, pulse 94, temperature 98.5 F (36.9 C), temperature  source Temporal, weight 196 lb 3.2 oz (89 kg).Body mass index is 27.36 kg/m.  General Appearance: Fairly Groomed  Eye Contact:  Good  Speech:  Clear and Coherent  Volume:  Normal  Mood:   good  Affect:  Appropriate, Congruent, and Full Range  Thought Process:  Coherent  Orientation:  Full (Time, Place, and Person)  Thought Content: Logical   Suicidal Thoughts:  No  Homicidal Thoughts:  No  Memory:  Immediate;   Good  Judgement:  Good  Insight:  Good  Psychomotor Activity:  Normal  Concentration:  Concentration: Good and Attention Span: Good  Recall:  Good  Fund of Knowledge: Good  Language: Good  Akathisia:  No  Handed:  Right  AIMS (if indicated): not done  Assets:  Communication  Skills Desire for Improvement  ADL's:  Intact  Cognition: WNL  Sleep:  Good   Screenings: GAD-7    Flowsheet Row Counselor from 07/17/2021 in [facility] Office Visit from 05/08/2021 in [facility] Office Visit from 11/20/2020 in [facility] Video Visit from 10/05/2020 in [facility] Office Visit from 02/07/2020 in [facility]  Total GAD-7 Score 6 2 2 16 2       PHQ2-9    Flowsheet Row Office Visit from 12/27/2021 in [facility] Most recent reading at 12/27/2021  1:33 PM Office Visit from 11/08/2021 in [facility] Most recent reading at 11/08/2021  8:05 AM Video Visit from 07/17/2021 in [facility] Most recent reading at 07/17/2021  4:19 PM Counselor from 07/17/2021 in [facility] Most recent reading at 07/17/2021 10:07 AM Office Visit from 05/08/2021 in [facility] Most recent reading at 05/08/2021  3:36 PM  PHQ-2 Total Score 0 0 1 0 0  PHQ-9 Total Score -- 3 -- -- 5      Flowsheet Row Video Visit from 07/17/2021 in [facility] Most  recent reading at 07/17/2021  4:19 PM Counselor from 07/17/2021 in [facility] Most recent reading at 07/17/2021 10:07 AM  C-SSRS RISK CATEGORY No Risk No Risk        Assessment and Plan:  Craig Andrade is a 31 y.o. year old male with a history of depression, anxiety, who presents for follow up appointment for below.   1. MDD (major depressive disorder), recurrent, in partial remission (HCC) He denies any mood symptoms since the last visit.  He reports orgasmic dysfunction since being on the antidepressant.  He is interested in switching back to fluoxetine as he had limited side effect from the medication.  However, he agrees to proceed one medication change at the time; will continue current dose of venlafaxine at this time, and will consider switching back to fluoxetine in the future.  Risk of serotonin syndrome with concomitant use of stimulant has been discussed.   2. Attention deficit hyperactivity disorder (ADHD), combined type Neuropsychological testing was consistent with ADHD.  There has been overall improvement in inattention since starting Retalin.  We do further uptitration to optimize treatment for ADHD.  Discussed potential risk of headache, insomnia, palpitation and hypertension.    Plan Increase Ritalin 20 mg twice a day  Continue venlafaxine 150 mg daily  Next appointment: 9/11 at 8 AM for 30 mins, in person, video [email or web address]     Past trials of medication: lexapro, fluoxetine, venlafaxine, bupropion, Ritalin (felt spacy), Concerta, Focalin, Adderall, Vyvanse, Strattera (he had some side effect from stimulant, including nausea.)   The patient demonstrates the following risk factors for suicide: Chronic risk factors for suicide include: psychiatric disorder of depression . Acute risk factors for suicide include: N/A. Protective factors for this patient include: coping skills and hope for the future. Considering these factors, the overall  suicide risk at this point appears to be low. Patient is appropriate for outpatient follow up.         Collaboration of Care: Collaboration of Care: Other N/A  Patient/Guardian was advised Release of Information must be obtained prior to any record release in order to collaborate their care with an outside provider. Patient/Guardian was advised if they have not already done so to contact the registration department to sign all necessary forms  in order for [place] to release information regarding their care.   Consent: Patient/Guardian gives verbal consent for treatment and assignment of benefits for services provided during this visit. Patient/Guardian expressed understanding and agreed to proceed.    Neysa Hotter, MD 01/07/2022, 8:40 AM

## 2022-01-07 ENCOUNTER — Ambulatory Visit (INDEPENDENT_AMBULATORY_CARE_PROVIDER_SITE_OTHER): Payer: BC Managed Care – PPO | Admitting: Psychiatry

## 2022-01-07 ENCOUNTER — Encounter: Payer: Self-pay | Admitting: Psychiatry

## 2022-01-07 VITALS — BP 117/72 | HR 94 | Temp 98.5°F | Wt 196.2 lb

## 2022-01-07 DIAGNOSIS — F3341 Major depressive disorder, recurrent, in partial remission: Secondary | ICD-10-CM

## 2022-01-07 DIAGNOSIS — F902 Attention-deficit hyperactivity disorder, combined type: Secondary | ICD-10-CM

## 2022-01-07 MED ORDER — METHYLPHENIDATE HCL 20 MG PO TABS: 20.0000 mg | ORAL_TABLET | Freq: Every day | ORAL | 0 refills | Status: DC

## 2022-01-07 MED ORDER — VENLAFAXINE HCL ER 150 MG PO CP24: 150.0000 mg | ORAL_CAPSULE | Freq: Every day | ORAL | 0 refills | Status: DC

## 2022-01-07 NOTE — Patient Instructions (Signed)
Increase Ritalin 20 mg twice a day  Continue venlafaxine 150 mg daily  Next appointment: 9/11 at 8 AM

## 2022-02-18 ENCOUNTER — Ambulatory Visit: Payer: BC Managed Care – PPO | Admitting: Psychiatry

## 2022-03-14 NOTE — Progress Notes (Signed)
BH MD/PA/NP OP Progress Note  03/18/2022 1:41 PM Craig Andrade  MRN:  [id number]  Chief Complaint:  Chief Complaint  Patient presents with   Follow-up   Medication Refill   HPI:  This is a follow-up appointment for depression and an ADHD.  He states that he is not doing well.  He is not interested in doing things.  His house is not clean as it used to.  He is using app to enter his mood as he tends to subconsciously ignore his mood.  He thinks it has been getting worse over the past month.  Although he still goes to rock climbing gym, he sometimes feels that others do not want to hang out with him, and feels obligated to do so.  He is stressed at work.  He has been doing the work, which was the reason for him to leave the other job for the past 2 weeks.  He feels stuck.  Although he does not feel valued at work, he does not have any other job opportunity outside of the town, and he does not want to be away from his friends.  Although he still communicates with his family, it is being a little difficult as it is different compared to in person.  He broke up with his girlfriend as he did not like the way she has been pushing him.  He thinks he has a little more social anxiety. The patient has mood symptoms as in PHQ-9/GAD-7.  He tends to do binge eating when he feels depressed.  He tends to have down mood around the winter season.  He denies SI, although he feels comfort by the way of him thinking that "nothing lasts forever." He denies alcohol use, drug use or cigarette use.  He thinks his focus has been much better with higher dose of Ritalin.  He does not notice any side effect from higher dose. He is willing to see a therapist. He is hoping to find a one who works outside of normal business hours.   7/23- BP 120/78 Wt Readings from Last 3 Encounters:  03/18/22 200 lb 6.4 oz (90.9 kg)  01/07/22 196 lb 3.2 oz (89 kg)  12/27/21 198 lb 12.8 oz (90.2 kg)     Visit Diagnosis:    ICD-10-CM   1.  Attention deficit hyperactivity disorder (ADHD), combined type, moderate  F90.2     2. MDD (major depressive disorder), recurrent episode, moderate (HCC)  F33.1 venlafaxine XR (EFFEXOR-XR) 150 MG 24 hr capsule      Past Psychiatric History: Please see initial evaluation for full details. I have reviewed the history. No updates at this time.     Past Medical History: History reviewed. No pertinent past medical history. History reviewed. No pertinent surgical history.  Family Psychiatric History: Please see initial evaluation for full details. I have reviewed the history. No updates at this time.     Family History:  Family History  Problem Relation Age of Onset   Depression Mother    Healthy Mother    Healthy Father    Anxiety disorder Brother     Social History:  Social History   Socioeconomic History   Marital status: Single    Spouse name: Not on file   Number of children: Not on file   Years of education: Not on file   Highest education level: Not on file  Occupational History   Not on file  Tobacco Use   Smoking status: Never  Smokeless tobacco: Never  Vaping Use   Vaping Use: Never used  Substance and Sexual Activity   Alcohol use: Yes    Alcohol/week: 1.0 standard drink of alcohol    Types: 1 Glasses of wine per week    Comment: drinks no more than 2 beers on occassion.    Drug use: Never    Comment: No illicit drug use   Sexual activity: Not Currently  Other Topics Concern   Not on file  Social History Narrative   Not on file   Social Determinants of Health   Financial Resource Strain: Low Risk  (07/17/2021)   Overall Financial Resource Strain (CARDIA)    Difficulty of Paying Living Expenses: Not hard at all  Food Insecurity: No Food Insecurity (07/17/2021)   Hunger Vital Sign    Worried About Running Out of Food in the Last Year: Never true    Ran Out of Food in the Last Year: Never true  Transportation Needs: No Transportation Needs (07/17/2021)    PRAPARE - Hydrologist (Medical): No    Lack of Transportation (Non-Medical): No  Physical Activity: Sufficiently Active (07/17/2021)   Exercise Vital Sign    Days of Exercise per Week: 5 days    Minutes of Exercise per Session: 60 min  Stress: Stress Concern Present (07/17/2021)   [place]    Feeling of Stress : To some extent  Social Connections: Socially Isolated (07/17/2021)   Social Connection and Isolation Panel [NHANES]    Frequency of Communication with Friends and Family: Once a week    Frequency of Social Gatherings with Friends and Family: Three times a week    Attends Religious Services: Never    Active Member of Clubs or Organizations: No    Attends Archivist Meetings: Never    Marital Status: Never married    Allergies: No Known Allergies  Metabolic Disorder Labs: No results found for: "HGBA1C", "MPG" No results found for: "PROLACTIN" Lab Results  Component Value Date   CHOL 194 05/16/2021   TRIG 65.0 05/16/2021   HDL 49.80 05/16/2021   CHOLHDL 4 05/16/2021   VLDL 13.0 05/16/2021   LDLCALC 131 (H) 05/16/2021   LDLCALC 122 (H) 02/07/2020   Lab Results  Component Value Date   TSH 2.31 02/04/2019    Therapeutic Level Labs: No results found for: "LITHIUM" No results found for: "VALPROATE" No results found for: "CBMZ"  Current Medications: Current Outpatient Medications  Medication Sig Dispense Refill   methylphenidate (RITALIN) 20 MG tablet Take 1 tablet (20 mg total) by mouth 2 (two) times daily. 60 tablet 0   [START ON 04/17/2022] methylphenidate (RITALIN) 20 MG tablet Take 1 tablet (20 mg total) by mouth 2 (two) times daily. 60 tablet 0   venlafaxine XR (EFFEXOR-XR) 37.5 MG 24 hr capsule Take 1 capsule (37.5 mg total) by mouth daily with breakfast. Total of 187.5 mg daily. Take along with 150 mg cap 30 capsule 1   [START ON 04/08/2022] venlafaxine XR  (EFFEXOR-XR) 150 MG 24 hr capsule Take 1 capsule (150 mg total) by mouth daily. 90 capsule 0   No current facility-administered medications for this visit.     Musculoskeletal: Strength & Muscle Tone: within normal limits Gait & Station: normal Patient leans: N/A  Psychiatric Specialty Exam: Review of Systems  Psychiatric/Behavioral:  Positive for decreased concentration and dysphoric mood. Negative for agitation, behavioral problems, confusion, hallucinations, self-injury, sleep disturbance  and suicidal ideas. The patient is nervous/anxious. The patient is not hyperactive.   All other systems reviewed and are negative.   Blood pressure (!) 143/83, pulse 92, temperature 98.2 F (36.8 C), temperature source Temporal, height 5\' 11"  (1.803 m), weight 200 lb 6.4 oz (90.9 kg).Body mass index is 27.95 kg/m.  General Appearance: Fairly Groomed  Eye Contact:  Good  Speech:  Clear and Coherent  Volume:  Normal  Mood:  Depressed  Affect:  Appropriate, Congruent, and slightly down  Thought Process:  Coherent  Orientation:  Full (Time, Place, and Person)  Thought Content: Logical   Suicidal Thoughts:  No  Homicidal Thoughts:  No  Memory:  Immediate;   Good  Judgement:  Good  Insight:  Good  Psychomotor Activity:  Normal  Concentration:  Concentration: Good and Attention Span: Good  Recall:  Good  Fund of Knowledge: Good  Language: Good  Akathisia:  No  Handed:  Right  AIMS (if indicated): not done  Assets:  Communication Skills Desire for Improvement  ADL's:  Intact  Cognition: WNL  Sleep:  Fair   Screenings: GAD-7    Flowsheet Row Office Visit from 03/18/2022 in [facility] Counselor from 07/17/2021 in [facility] Office Visit from 05/08/2021 in [facility] Office Visit from 11/20/2020 in [facility] Video Visit from 10/05/2020 in [facility]  Total GAD-7  Score 13 6 2 2 16       PHQ2-9    Flowsheet Row Office Visit from 03/18/2022 in [facility] Most recent reading at 03/18/2022  1:31 PM Office Visit from 12/27/2021 in [facility] Most recent reading at 12/27/2021  1:33 PM Office Visit from 11/08/2021 in [facility] Most recent reading at 11/08/2021  8:05 AM Video Visit from 07/17/2021 in [facility] Most recent reading at 07/17/2021  4:19 PM Counselor from 07/17/2021 in [facility] Most recent reading at 07/17/2021 10:07 AM  PHQ-2 Total Score 6 0 0 1 0  PHQ-9 Total Score 17 -- 3 -- --      Flowsheet Row Video Visit from 07/17/2021 in [facility] Most recent reading at 07/17/2021  4:19 PM Counselor from 07/17/2021 in [facility] Most recent reading at 07/17/2021 10:07 AM  C-SSRS RISK CATEGORY No Risk No Risk        Assessment and Plan:  Craig Andrade is a 31 y.o. year old male with a history of depression, anxiety, who presents for follow up appointment for below.   1. MDD (major depressive disorder), recurrent episode, moderate (HCC) There has been worsening in depressive symptoms and anxiety without significant triggers.  Psychosocial stressors includes work stress, break-up.  He continues to do rock climbing despite his anhedonia.  Will uptitrate venlafaxine to optimize treatment for depression.  Discussed potential risk of hypertension, headache.  He was also advised to try light therapy given he reports some patterns of seasonal depression. He will greatly benefit from CBT; he was advised to look into psychology today to find a therapist.   2. Attention deficit hyperactivity disorder (ADHD), combined type, moderate Neuropsychological testing was consistent with ADHD.  He had a good benefit from higher dose of Ritalin.  Will do twice a day dosing to optimize  the treatment.  Discussed potential risk of headache, insomnia, palpitation and hypertension.    Plan Increase Ritalin 20 mg twice a day  Increase venlafaxine 187.5 mg daily  Next appointment: 12/4 at 2:30 for 30 mins, in person, video [email or web address]     Past trials of medication: lexapro, fluoxetine, venlafaxine, bupropion, Ritalin (felt spacy), Concerta, Focalin, Adderall, Vyvanse, Strattera (he had some side effect from stimulant, including nausea.)   The patient demonstrates the following risk factors for suicide: Chronic risk factors for suicide include: psychiatric disorder of depression . Acute risk factors for suicide include: N/A. Protective factors for this patient include: coping skills and hope for the future. Considering these factors, the overall suicide risk at this point appears to be low. Patient is appropriate for outpatient follow up.          Collaboration of Care: Collaboration of Care: Other reviewed notes in Epic  Patient/Guardian was advised Release of Information must be obtained prior to any record release in order to collaborate their care with an outside provider. Patient/Guardian was advised if they have not already done so to contact the registration department to sign all necessary forms in order for [place] to release information regarding their care.   Consent: Patient/Guardian gives verbal consent for treatment and assignment of benefits for services provided during this visit. Patient/Guardian expressed understanding and agreed to proceed.    Neysa Hotter, MD 03/18/2022, 1:41 PM

## 2022-03-18 ENCOUNTER — Encounter: Payer: Self-pay | Admitting: Psychiatry

## 2022-03-18 ENCOUNTER — Ambulatory Visit (INDEPENDENT_AMBULATORY_CARE_PROVIDER_SITE_OTHER): Payer: BC Managed Care – PPO | Admitting: Psychiatry

## 2022-03-18 VITALS — BP 143/83 | HR 92 | Temp 98.2°F | Ht 71.0 in | Wt 200.4 lb

## 2022-03-18 DIAGNOSIS — F902 Attention-deficit hyperactivity disorder, combined type: Secondary | ICD-10-CM

## 2022-03-18 DIAGNOSIS — F331 Major depressive disorder, recurrent, moderate: Secondary | ICD-10-CM

## 2022-03-18 MED ORDER — VENLAFAXINE HCL ER 37.5 MG PO CP24: 37.5000 mg | ORAL_CAPSULE | Freq: Every day | ORAL | 1 refills | Status: DC

## 2022-03-18 MED ORDER — METHYLPHENIDATE HCL 20 MG PO TABS: 20.0000 mg | ORAL_TABLET | Freq: Two times a day (BID) | ORAL | 0 refills | Status: DC

## 2022-03-18 MED ORDER — VENLAFAXINE HCL ER 150 MG PO CP24: 150.0000 mg | ORAL_CAPSULE | Freq: Every day | ORAL | 0 refills | Status: DC

## 2022-03-18 NOTE — Patient Instructions (Signed)
Increase Ritalin 20 mg twice a day  Increase venlafaxine 187.5 mg daily  Next appointment: 12/4 at 2:30

## 2022-04-03 DIAGNOSIS — M9901 Segmental and somatic dysfunction of cervical region: Secondary | ICD-10-CM | POA: Diagnosis not present

## 2022-04-03 DIAGNOSIS — M9902 Segmental and somatic dysfunction of thoracic region: Secondary | ICD-10-CM | POA: Diagnosis not present

## 2022-04-03 DIAGNOSIS — M531 Cervicobrachial syndrome: Secondary | ICD-10-CM | POA: Diagnosis not present

## 2022-04-03 DIAGNOSIS — M9908 Segmental and somatic dysfunction of rib cage: Secondary | ICD-10-CM | POA: Diagnosis not present

## 2022-04-05 ENCOUNTER — Telehealth: Payer: Self-pay

## 2022-04-05 ENCOUNTER — Other Ambulatory Visit: Payer: Self-pay | Admitting: Psychiatry

## 2022-04-05 MED ORDER — VENLAFAXINE HCL ER 75 MG PO CP24: 75.0000 mg | ORAL_CAPSULE | Freq: Every day | ORAL | 1 refills | Status: DC

## 2022-04-05 NOTE — Telephone Encounter (Signed)
Ordered venlafaxine

## 2022-04-05 NOTE — Telephone Encounter (Signed)
Talked with the patient.  He continues to struggle with depression.  Although he used to notice difference after a week after starting the medication, he does not notice any difference at all. He denies SI. He denies any side effects since uptitration.  Although medication becomes effective after a few weeks, it is reasonable to do further uptitration at this time.  He agrees with the following.  -Increase venlafaxine 225 mg daily.  -monitor BP -keep the appointment in Dec

## 2022-04-05 NOTE — Telephone Encounter (Signed)
pt called states that the dosage increase on the venlafaxine  doesn't seem to be working.  wanted to know if you would increase or change to something else.  If you would call him.   Pt last seen on  03-18-22  next appt 05-13-22

## 2022-04-10 ENCOUNTER — Other Ambulatory Visit: Payer: Self-pay | Admitting: Psychiatry

## 2022-05-03 ENCOUNTER — Other Ambulatory Visit: Payer: Self-pay | Admitting: Psychiatry

## 2022-05-10 NOTE — Progress Notes (Unsigned)
Virtual Visit via Video Note  I connected with Craig Andrade on 05/13/22 at  2:30 PM EST by a video enabled telemedicine application and verified that I am speaking with the correct person using two identifiers.  Location: Patient: home Provider: office Persons participated in the visit- patient, provider    I discussed the limitations of evaluation and management by telemedicine and the availability of in person appointments. The patient expressed understanding and agreed to proceed.   I discussed the assessment and treatment plan with the patient. The patient was provided an opportunity to ask questions and all were answered. The patient agreed with the plan and demonstrated an understanding of the instructions.   The patient was advised to call back or seek an in-person evaluation if the symptoms worsen or if the condition fails to improve as anticipated.  I provided 18 minutes of non-face-to-face time during this encounter.   Norman Clay, MD    [facility] MD/PA/NP OP Progress Note  05/13/2022 3:04 PM Craig Andrade  MRN:  [id number]  Chief Complaint:  Chief Complaint  Patient presents with   Depression   Follow-up   HPI:  This is a follow-up appointment for depression and ADHD.  He states that things are going alright.  He thinks higher dose of venlafaxine has been helping.  He does not feel hopeless anymore.  He continues to go to work during the day.  He goes to rockclimbing 4 times a day, although he sometimes needs to make himself do things.  He has been reading a book about social anxiety. It has been helpful.  He is planning to stay at the current job.  Although he briefly look at the other job, he struggled with motivation and energy.  He feels like there is a big mental wall standing in front of him.  He sleeps well.  He does not do binge eating anymore.  He has better concentration since taking Ritalin him more frequently.  He denies SI.  He denies alcohol use or drug  use.  He feels comfortable to stay at the current medication regimen at this time.   Visit Diagnosis:    ICD-10-CM   1. MDD (major depressive disorder), recurrent episode, moderate (HCC)  F33.1     2. Attention deficit hyperactivity disorder (ADHD), combined type, moderate  F90.2       Past Psychiatric History: Please see initial evaluation for full details. I have reviewed the history. No updates at this time.     Past Medical History: No past medical history on file. No past surgical history on file.  Family Psychiatric History: Please see initial evaluation for full details. I have reviewed the history. No updates at this time.     Family History:  Family History  Problem Relation Age of Onset   Depression Mother    Healthy Mother    Healthy Father    Anxiety disorder Brother     Social History:  Social History   Socioeconomic History   Marital status: Single    Spouse name: Not on file   Number of children: Not on file   Years of education: Not on file   Highest education level: Not on file  Occupational History   Not on file  Tobacco Use   Smoking status: Never   Smokeless tobacco: Never  Vaping Use   Vaping Use: Never used  Substance and Sexual Activity   Alcohol use: Yes    Alcohol/week: 1.0 standard drink of  alcohol    Types: 1 Glasses of wine per week    Comment: drinks no more than 2 beers on occassion.    Drug use: Never    Comment: No illicit drug use   Sexual activity: Not Currently  Other Topics Concern   Not on file  Social History Narrative   Not on file   Social Determinants of Health   Financial Resource Strain: Low Risk  (07/17/2021)   Overall Financial Resource Strain (CARDIA)    Difficulty of Paying Living Expenses: Not hard at all  Food Insecurity: No Food Insecurity (07/17/2021)   Hunger Vital Sign    Worried About Running Out of Food in the Last Year: Never true    Ran Out of Food in the Last Year: Never true  Transportation Needs:  No Transportation Needs (07/17/2021)   PRAPARE - Hydrologist (Medical): No    Lack of Transportation (Non-Medical): No  Physical Activity: Sufficiently Active (07/17/2021)   Exercise Vital Sign    Days of Exercise per Week: 5 days    Minutes of Exercise per Session: 60 min  Stress: Stress Concern Present (07/17/2021)   [place]    Feeling of Stress : To some extent  Social Connections: Socially Isolated (07/17/2021)   Social Connection and Isolation Panel [NHANES]    Frequency of Communication with Friends and Family: Once a week    Frequency of Social Gatherings with Friends and Family: Three times a week    Attends Religious Services: Never    Active Member of Clubs or Organizations: No    Attends Archivist Meetings: Never    Marital Status: Never married    Allergies: No Known Allergies  Metabolic Disorder Labs: No results found for: "HGBA1C", "MPG" No results found for: "PROLACTIN" Lab Results  Component Value Date   CHOL 194 05/16/2021   TRIG 65.0 05/16/2021   HDL 49.80 05/16/2021   CHOLHDL 4 05/16/2021   VLDL 13.0 05/16/2021   LDLCALC 131 (H) 05/16/2021   LDLCALC 122 (H) 02/07/2020   Lab Results  Component Value Date   TSH 2.31 02/04/2019    Therapeutic Level Labs: No results found for: "LITHIUM" No results found for: "VALPROATE" No results found for: "CBMZ"  Current Medications: Current Outpatient Medications  Medication Sig Dispense Refill   methylphenidate (RITALIN) 20 MG tablet Take 1 tablet (20 mg total) by mouth 2 (two) times daily. 60 tablet 0   methylphenidate (RITALIN) 20 MG tablet Take 1 tablet (20 mg total) by mouth 2 (two) times daily. 60 tablet 0   venlafaxine XR (EFFEXOR-XR) 150 MG 24 hr capsule Take 1 capsule (150 mg total) by mouth daily. 90 capsule 0   venlafaxine XR (EFFEXOR-XR) 75 MG 24 hr capsule Take 1 capsule (75 mg total) by mouth daily.  Take total of 225 mg daily. Take along with 150 mg cap 90 capsule 0   No current facility-administered medications for this visit.     Musculoskeletal: Strength & Muscle Tone:  Normal Gait & Station: normal Patient leans: N/A  Psychiatric Specialty Exam: Review of Systems  Psychiatric/Behavioral:  Positive for dysphoric mood. Negative for agitation, behavioral problems, confusion, decreased concentration, hallucinations, self-injury, sleep disturbance and suicidal ideas. The patient is nervous/anxious. The patient is not hyperactive.   All other systems reviewed and are negative.   There were no vitals taken for this visit.There is no height or weight on file to  calculate BMI.  General Appearance: Fairly Groomed  Eye Contact:  Good  Speech:  Clear and Coherent  Volume:  Normal  Mood:   better  Affect:  Appropriate, Congruent, and down  Thought Process:  Coherent  Orientation:  Full (Time, Place, and Person)  Thought Content: Logical   Suicidal Thoughts:  No  Homicidal Thoughts:  No  Memory:  Immediate;   Good  Judgement:  Good  Insight:  Good  Psychomotor Activity:  Normal  Concentration:  Concentration: Good and Attention Span: Good  Recall:  Good  Fund of Knowledge: Good  Language: Good  Akathisia:  No  Handed:  Right  AIMS (if indicated): not done  Assets:  Communication Skills Desire for Improvement  ADL's:  Intact  Cognition: WNL  Sleep:  Good   Screenings: GAD-7    Flowsheet Row Office Visit from 03/18/2022 in [place] from 07/17/2021 in [facility] Office Visit from 05/08/2021 in [facility] Visit from 11/20/2020 in [facility] Video Visit from 10/05/2020 in [facility]  Total GAD-7 Score 13 6 2 2 16       PHQ2-9    [place] Office Visit from 03/18/2022 in [place] Most recent  reading at 03/18/2022  1:31 PM Office Visit from 12/27/2021 in [facility] Most recent reading at 12/27/2021  1:33 PM Office Visit from 11/08/2021 in [place] Most recent reading at 11/08/2021  8:05 AM Video Visit from 07/17/2021 in [place] Most recent reading at 07/17/2021  4:19 PM Counselor from 07/17/2021 in [facility] Most recent reading at 07/17/2021 10:07 AM  PHQ-2 Total Score 6 0 0 1 0  PHQ-9 Total Score 17 -- 3 -- --      Flowsheet Row Video Visit from 07/17/2021 in [facility] [facility] Most recent reading at 07/17/2021  4:19 PM Counselor from 07/17/2021 in [facility] Most recent reading at 07/17/2021 10:07 AM  C-SSRS RISK CATEGORY No Risk No Risk        Assessment and Plan:  Craig Andrade is a 31 y.o. year old male with a history of depression, anxiety, who presents for follow up appointment for below.   1. MDD (major depressive disorder), recurrent episode, moderate (HCC) There has been overall improvement in depressive symptoms since recent uptitration of venlafaxine. Psychosocial stressors includes work stress, break-up.  He continues to do rock climbing despite his anhedonia.  Will continue current dose of venlafaxine to target depression.  He was advised again to try light therapy given there are some patterns of seasonal depression.  He will greatly from CBT.  He is willing to look into this (he has a conflict in the schedule)  2. Attention deficit hyperactivity disorder (ADHD), combined type, moderate Neuropsychological testing was consistent with ADHD.  He has great benefit from frequent dose of Ritalin.  Will continue current dose to target ADHD.   Plan Continue Ritalin 20 mg twice a day - one refill left Continue venlafaxine 225 mg daily  Next appointment: 12/21 a 2 PM for 30 mins, in person, video [email or web address]      Past trials of medication: lexapro, fluoxetine, venlafaxine, bupropion, Ritalin (felt spacy), Concerta, Focalin, Adderall, Vyvanse, Strattera (he had some side effect from stimulant, including nausea.)   The patient demonstrates the following risk factors for suicide: Chronic risk factors for suicide include: psychiatric disorder  of depression . Acute risk factors for suicide include: N/A. Protective factors for this patient include: coping skills and hope for the future. Considering these factors, the overall suicide risk at this point appears to be low. Patient is appropriate for outpatient follow up.           Collaboration of Care: Collaboration of Care: Other reviewed notes in Epic  Patient/Guardian was advised Release of Information must be obtained prior to any record release in order to collaborate their care with an outside provider. Patient/Guardian was advised if they have not already done so to contact the registration department to sign all necessary forms in order for [place] to release information regarding their care.   Consent: Patient/Guardian gives verbal consent for treatment and assignment of benefits for services provided during this visit. Patient/Guardian expressed understanding and agreed to proceed.    Neysa Hotter, MD 05/13/2022, 3:04 PM

## 2022-05-13 ENCOUNTER — Telehealth (INDEPENDENT_AMBULATORY_CARE_PROVIDER_SITE_OTHER): Payer: BC Managed Care – PPO | Admitting: Psychiatry

## 2022-05-13 ENCOUNTER — Encounter: Payer: Self-pay | Admitting: Psychiatry

## 2022-05-13 DIAGNOSIS — F902 Attention-deficit hyperactivity disorder, combined type: Secondary | ICD-10-CM | POA: Diagnosis not present

## 2022-05-13 DIAGNOSIS — F331 Major depressive disorder, recurrent, moderate: Secondary | ICD-10-CM

## 2022-05-13 NOTE — Patient Instructions (Signed)
Continue Ritalin 20 mg twice a day  Continue venlafaxine 225 mg daily  Next appointment: 12/21 a 2 PM  Generally, the light box should:  Emit full-spectrum light with either fluorescent or LED bulbs (fluorescent is usually recommended) Provide an exposure to 10,000 lux of light Produce as little UV light as possible  Typical recommendations include using the light box: Within the 30 mins to first hour of waking up in the morning For about 20 to 30 minutes About 16 to 24 inches (41 to 61 centimeters) from your face, but follow the manufacturer's instructions about distance With eyes open, but not looking directly at the light  Noted that Light boxes aren't regulated by the Food and Drug Administration (FDA) for SAD (seasonal affective disorder) treatment.

## 2022-05-22 ENCOUNTER — Ambulatory Visit (INDEPENDENT_AMBULATORY_CARE_PROVIDER_SITE_OTHER): Payer: BC Managed Care – PPO | Admitting: Family Medicine

## 2022-05-22 ENCOUNTER — Encounter: Payer: Self-pay | Admitting: Family Medicine

## 2022-05-22 VITALS — BP 128/76 | HR 90 | Temp 97.9°F | Ht 71.0 in | Wt 199.0 lb

## 2022-05-22 DIAGNOSIS — M79641 Pain in right hand: Secondary | ICD-10-CM | POA: Diagnosis not present

## 2022-05-22 DIAGNOSIS — Z0000 Encounter for general adult medical examination without abnormal findings: Secondary | ICD-10-CM | POA: Diagnosis not present

## 2022-05-22 NOTE — Progress Notes (Addendum)
Established Patient Office Visit   Subjective:  Patient ID: Craig Andrade, male    DOB: 05/12/1991  Age: 31 y.o. MRN: [id number]  Chief Complaint  Patient presents with   Annual Exam    CPE,  no concerns. Patient fasting.     HPI Encounter Diagnoses  Name Primary?   Healthcare maintenance Yes   Pain of right hand    Here for physical.  Mostly doing okay.  Exercises regularly by climbing.  He uses an indoor facility during this time of the year.  He has regular dental care.  Work has been stressful for him.  He is contemplating a change.  Continues follow-up with psychiatry for attention deficit, depression with anxiety.  He is considering talking therapy.  Right-hand-dominant.  Complains of right hand pain located between his second and third metacarpals..    Review of Systems  Constitutional: Negative.   HENT: Negative.    Eyes:  Negative for blurred vision, discharge and redness.  Respiratory: Negative.    Cardiovascular: Negative.   Gastrointestinal:  Negative for abdominal pain.  Genitourinary: Negative.   Musculoskeletal: Negative.  Negative for myalgias.  Skin:  Negative for rash.  Neurological:  Negative for tingling, loss of consciousness and weakness.  Endo/Heme/Allergies:  Negative for polydipsia.      05/22/2022    9:07 AM 03/18/2022    1:31 PM 12/27/2021    1:33 PM  Depression screen PHQ 2/9  Decreased Interest 0  0  Down, Depressed, Hopeless 1  0  PHQ - 2 Score 1  0  Altered sleeping     Tired, decreased energy     Change in appetite     Feeling bad or failure about yourself      Trouble concentrating     Moving slowly or fidgety/restless     Suicidal thoughts     PHQ-9 Score     Difficult doing work/chores        Information is confidential and restricted. Go to Review Flowsheets to unlock data.       Current Outpatient Medications:    methylphenidate (RITALIN) 20 MG tablet, Take 1 tablet (20 mg total) by mouth 2 (two) times daily., Disp: 60  tablet, Rfl: 0   venlafaxine XR (EFFEXOR-XR) 150 MG 24 hr capsule, Take 1 capsule (150 mg total) by mouth daily., Disp: 90 capsule, Rfl: 0   venlafaxine XR (EFFEXOR-XR) 75 MG 24 hr capsule, Take 1 capsule (75 mg total) by mouth daily. Take total of 225 mg daily. Take along with 150 mg cap, Disp: 90 capsule, Rfl: 0   Objective:     BP 128/76 (BP Location: Right Arm, Patient Position: Sitting, Cuff Size: Large)   Pulse 90   Temp 97.9 F (36.6 C)   Ht 5\' 11"  (1.803 m)   Wt 199 lb (90.3 kg)   SpO2 96%   BMI 27.75 kg/m    Physical Exam Constitutional:      General: He is not in acute distress.    Appearance: Normal appearance. He is not ill-appearing, toxic-appearing or diaphoretic.  HENT:     Head: Normocephalic and atraumatic.     Right Ear: External ear normal.     Left Ear: External ear normal.     Mouth/Throat:     Mouth: Mucous membranes are moist.     Pharynx: Oropharynx is clear. No oropharyngeal exudate or posterior oropharyngeal erythema.  Eyes:     General: No scleral icterus.  Right eye: No discharge.        Left eye: No discharge.     Extraocular Movements: Extraocular movements intact.     Conjunctiva/sclera: Conjunctivae normal.     Pupils: Pupils are equal, round, and reactive to light.  Cardiovascular:     Rate and Rhythm: Normal rate and regular rhythm.  Pulmonary:     Effort: Pulmonary effort is normal. No respiratory distress.     Breath sounds: Normal breath sounds.  Abdominal:     General: Bowel sounds are normal. There is no distension.     Tenderness: There is no abdominal tenderness. There is no guarding or rebound.     Hernia: There is no hernia in the left inguinal area or right inguinal area.  Genitourinary:    Penis: Circumcised. No hypospadias, erythema, tenderness, discharge, swelling or lesions.      Testes:        Right: Mass, tenderness or swelling not present. Right testis is descended.        Left: Mass, tenderness or swelling not  present. Left testis is descended.     Epididymis:     Right: Not inflamed or enlarged.     Left: Not inflamed or enlarged.  Musculoskeletal:     Right hand: No swelling, tenderness or bony tenderness. Normal range of motion. Normal strength. Normal pulse.     Cervical back: No rigidity or tenderness.  Lymphadenopathy:     Lower Body: No right inguinal adenopathy. No left inguinal adenopathy.  Skin:    General: Skin is warm and dry.  Neurological:     Mental Status: He is alert and oriented to person, place, and time.  Psychiatric:        Mood and Affect: Mood normal.        Behavior: Behavior normal.      No results found for any visits on 05/22/22.    The ASCVD Risk score (Arnett DK, et al., 2019) failed to calculate for the following reasons:   The 2019 ASCVD risk score is only valid for ages 7 to 65    Assessment & Plan:   Healthcare maintenance -     CBC; Future -     Comprehensive metabolic panel; Future -     Lipid panel; Future -     Urinalysis, Routine w reflex microscopic; Future  Pain of right hand -     Ambulatory referral to Sports Medicine    Return in about 1 year (around 05/23/2023), or if symptoms worsen or fail to improve.  Encouraged him to continue his healthy active lifestyle.  Information was given on health maintenance and disease prevention.  No obvious finding for hand pain.  Agrees to go for sports medicine consultation.  During his stressful period  In his life I advised him to stay active.  Libby Maw, MD

## 2022-05-28 NOTE — Progress Notes (Unsigned)
    Aleen Sells D.[facility] [address] [place] [address] Phone: [phone]   Assessment and Plan:     There are no diagnoses linked to this encounter.  ***   Pertinent previous records reviewed include ***   Follow Up: ***     Subjective:   I, Craig Andrade, am serving as a Neurosurgeon for Craig Andrade  Chief Complaint: right hand pain   HPI:   05/29/2022 Patient is a 31 year old male complaining of right hand pain. Patient states  Relevant Historical Information: ***  Additional pertinent review of systems negative.   Current Outpatient Medications:    methylphenidate (RITALIN) 20 MG tablet, Take 1 tablet (20 mg total) by mouth 2 (two) times daily., Disp: 60 tablet, Rfl: 0   venlafaxine XR (EFFEXOR-XR) 150 MG 24 hr capsule, Take 1 capsule (150 mg total) by mouth daily., Disp: 90 capsule, Rfl: 0   venlafaxine XR (EFFEXOR-XR) 75 MG 24 hr capsule, Take 1 capsule (75 mg total) by mouth daily. Take total of 225 mg daily. Take along with 150 mg cap, Disp: 90 capsule, Rfl: 0   Objective:     There were no vitals filed for this visit.    There is no height or weight on file to calculate BMI.    Physical Exam:    ***   Electronically signed by:  Aleen Sells D.[facility] 7:38 AM 05/28/22

## 2022-05-29 ENCOUNTER — Ambulatory Visit (INDEPENDENT_AMBULATORY_CARE_PROVIDER_SITE_OTHER): Payer: BC Managed Care – PPO

## 2022-05-29 ENCOUNTER — Other Ambulatory Visit (INDEPENDENT_AMBULATORY_CARE_PROVIDER_SITE_OTHER): Payer: BC Managed Care – PPO

## 2022-05-29 ENCOUNTER — Ambulatory Visit (INDEPENDENT_AMBULATORY_CARE_PROVIDER_SITE_OTHER): Payer: BC Managed Care – PPO | Admitting: Sports Medicine

## 2022-05-29 VITALS — BP 120/80 | HR 94 | Ht 71.0 in | Wt 199.0 lb

## 2022-05-29 DIAGNOSIS — M79641 Pain in right hand: Secondary | ICD-10-CM

## 2022-05-29 DIAGNOSIS — Z0000 Encounter for general adult medical examination without abnormal findings: Secondary | ICD-10-CM

## 2022-05-29 LAB — LIPID PANEL
Cholesterol: 200 mg/dL (ref 0–200)
HDL: 56.5 mg/dL (ref >39.00)
LDL Cholesterol: 129 mg/dL — ABNORMAL HIGH (ref 0–99)
NonHDL: 143.62
Total CHOL/HDL Ratio: 4
Triglycerides: 71 mg/dL (ref 0.0–149.0)
VLDL: 14.2 mg/dL (ref 0.0–40.0)

## 2022-05-29 LAB — COMPREHENSIVE METABOLIC PANEL
ALT: 18 U/L (ref 0–53)
AST: 19 U/L (ref 0–37)
Albumin: 4.5 g/dL (ref 3.5–5.2)
Alkaline Phosphatase: 67 U/L (ref 39–117)
BUN: 12 mg/dL (ref 6–23)
CO2: 32 mEq/L (ref 19–32)
Calcium: 9.6 mg/dL (ref 8.4–10.5)
Chloride: 103 mEq/L (ref 96–112)
Creatinine, Ser: 1.08 mg/dL (ref 0.40–1.50)
GFR: 92.87 mL/min (ref >60.00)
Glucose, Bld: 84 mg/dL (ref 70–99)
Potassium: 4.2 mEq/L (ref 3.5–5.1)
Sodium: 141 mEq/L (ref 135–145)
Total Bilirubin: 0.4 mg/dL (ref 0.2–1.2)
Total Protein: 6.9 g/dL (ref 6.0–8.3)

## 2022-05-29 LAB — CBC
HCT: 43.6 % (ref 39.0–52.0)
Hemoglobin: 15 g/dL (ref 13.0–17.0)
MCHC: 34.4 g/dL (ref 30.0–36.0)
MCV: 86.6 fl (ref 78.0–100.0)
Platelets: 238 10*3/uL (ref 150.0–400.0)
RBC: 5.04 Mil/uL (ref 4.22–5.81)
RDW: 13.1 % (ref 11.5–15.5)
WBC: 4.8 10*3/uL (ref 4.0–10.5)

## 2022-05-29 MED ORDER — MELOXICAM 15 MG PO TABS: 15.0000 mg | ORAL_TABLET | Freq: Every day | ORAL | 0 refills | Status: DC

## 2022-05-29 NOTE — Progress Notes (Unsigned)
BH MD/PA/NP OP Progress Note  05/30/2022 2:38 PM Craig Andrade  MRN:  [id number]  Chief Complaint:  Chief Complaint  Patient presents with   Follow-up   HPI:  This is a follow-up appointment for depression and ADHD.  He is states that he is not doing good at all.  He has been stressed especially this week.  He reports less support at work, and his supervisor is not there.  He also feels stressed with his family.  Although he wants to spend time with them, they are extroverted, and he feels exhausted.  His brother is in [place], and his parents are in [place].  He states that nothing is fun anymore, although he has been doing rock climbing until he injured his hands.  He is considering going to a gym to stay active.  He has fair sleep most of the time.  He tends to overeat when he is stressed. The patient has mood symptoms as in PHQ-9/GAD-7. He denies SI.  He drinks alcohol only occasionally on weekend.  He denies drug use.  He states that he gained weight since COVID, and reports concern of medication which can potentially cause weight gain.  Of note, he occasionally misses to take venlafaxine (2 days out of one week).    Wt Readings from Last 3 Encounters:  05/30/22 200 lb (90.7 kg)  05/29/22 199 lb (90.3 kg)  05/22/22 199 lb (90.3 kg)   03/18/22 200 lb 6.4 oz (90.9 kg)  01/07/22 196 lb 3.2 oz (89 kg)  12/27/21 198 lb 12.8 oz (90.2 kg)     05/29/22 1443   BP: 120/80    Visit Diagnosis:    ICD-10-CM   1. MDD (major depressive disorder), recurrent episode, moderate (HCC)  F33.1 EKG 12-Lead    venlafaxine XR (EFFEXOR-XR) 150 MG 24 hr capsule    2. Attention deficit hyperactivity disorder (ADHD), combined type, moderate  F90.2       Past Psychiatric History: Please see initial evaluation for full details. I have reviewed the history. No updates at this time.     Past Medical History: No past medical history on file. No past surgical history on file.  Family  Psychiatric History: Please see initial evaluation for full details. I have reviewed the history. No updates at this time.     Family History:  Family History  Problem Relation Age of Onset   Depression Mother    Healthy Mother    Healthy Father    Anxiety disorder Brother     Social History:  Social History   Socioeconomic History   Marital status: Single    Spouse name: Not on file   Number of children: Not on file   Years of education: Not on file   Highest education level: Not on file  Occupational History   Not on file  Tobacco Use   Smoking status: Never   Smokeless tobacco: Never  Vaping Use   Vaping Use: Never used  Substance and Sexual Activity   Alcohol use: Yes    Alcohol/week: 1.0 standard drink of alcohol    Types: 1 Glasses of wine per week    Comment: drinks no more than 2 beers on occassion.    Drug use: Never    Comment: No illicit drug use   Sexual activity: Not Currently  Other Topics Concern   Not on file  Social History Narrative   Not on file   Social Determinants of Health  Financial Resource Strain: Low Risk  (07/17/2021)   Overall Financial Resource Strain (CARDIA)    Difficulty of Paying Living Expenses: Not hard at all  Food Insecurity: No Food Insecurity (07/17/2021)   Hunger Vital Sign    Worried About Running Out of Food in the Last Year: Never true    Ran Out of Food in the Last Year: Never true  Transportation Needs: No Transportation Needs (07/17/2021)   PRAPARE - Administrator, Civil Service (Medical): No    Lack of Transportation (Non-Medical): No  Physical Activity: Sufficiently Active (07/17/2021)   Exercise Vital Sign    Days of Exercise per Week: 5 days    Minutes of Exercise per Session: 60 min  Stress: Stress Concern Present (07/17/2021)   Craig Andrade [employer] - Occupational Stress Questionnaire    Feeling of Stress : To some extent  Social Connections: Socially Isolated (07/17/2021)   Social  Connection and Isolation Panel [NHANES]    Frequency of Communication with Friends and Family: Once a week    Frequency of Social Gatherings with Friends and Family: Three times a week    Attends Religious Services: Never    Active Member of Clubs or Organizations: No    Attends Banker Meetings: Never    Marital Status: Never married    Allergies: No Known Allergies  Metabolic Disorder Labs: No results found for: "HGBA1C", "MPG" No results found for: "PROLACTIN" Lab Results  Component Value Date   CHOL 200 05/29/2022   TRIG 71.0 05/29/2022   HDL 56.50 05/29/2022   CHOLHDL 4 05/29/2022   VLDL 14.2 05/29/2022   LDLCALC 129 (H) 05/29/2022   LDLCALC 131 (H) 05/16/2021   Lab Results  Component Value Date   TSH 2.31 02/04/2019    Therapeutic Level Labs: No results found for: "LITHIUM" No results found for: "VALPROATE" No results found for: "CBMZ"  Current Medications: Current Outpatient Medications  Medication Sig Dispense Refill   meloxicam (MOBIC) 15 MG tablet Take 1 tablet (15 mg total) by mouth daily. 30 tablet 0   methylphenidate (RITALIN) 20 MG tablet Take 1 tablet (20 mg total) by mouth 2 (two) times daily. 60 tablet 0   [START ON 06/29/2022] methylphenidate (RITALIN) 20 MG tablet Take 1 tablet (20 mg total) by mouth 2 (two) times daily. 60 tablet 0   venlafaxine XR (EFFEXOR-XR) 75 MG 24 hr capsule Take 1 capsule (75 mg total) by mouth daily. Take total of 225 mg daily. Take along with 150 mg cap 90 capsule 0   methylphenidate (RITALIN) 20 MG tablet Take 1 tablet (20 mg total) by mouth 2 (two) times daily. 60 tablet 0   [START ON 07/08/2022] venlafaxine XR (EFFEXOR-XR) 150 MG 24 hr capsule Take 1 capsule (150 mg total) by mouth daily. 90 capsule 0   No current facility-administered medications for this visit.     Musculoskeletal: Strength & Muscle Tone: within normal limits Gait & Station: normal Patient leans: N/A  Psychiatric Specialty Exam: Review  of Systems  Psychiatric/Behavioral:  Positive for decreased concentration, dysphoric mood and sleep disturbance. Negative for agitation, behavioral problems, confusion, hallucinations, self-injury and suicidal ideas. The patient is nervous/anxious. The patient is not hyperactive.   All other systems reviewed and are negative.   Blood pressure (!) 143/78, pulse 90, temperature 98.3 F (36.8 C), temperature source Oral, height 5\' 11"  (1.803 m), weight 200 lb (90.7 kg).Body mass index is 27.89 kg/m.  General Appearance: Fairly Groomed  Eye  Contact:  Good  Speech:  Clear and Coherent  Volume:  Normal  Mood:  Depressed  Affect:  Appropriate, Congruent, and down  Thought Process:  Coherent  Orientation:  Full (Time, Place, and Person)  Thought Content: Logical   Suicidal Thoughts:  No  Homicidal Thoughts:  No  Memory:  Immediate;   Good  Judgement:  Good  Insight:  Good  Psychomotor Activity:  Normal  Concentration:  Concentration: Good and Attention Span: Good  Recall:  Good  Fund of Knowledge: Good  Language: Good  Akathisia:  No  Handed:  Right  AIMS (if indicated): not done  Assets:  Communication Skills Desire for Improvement  ADL's:  Intact  Cognition: WNL  Sleep:  Fair   Screenings: GAD-7    Flowsheet Row Office Visit from 05/30/2022 in [facility] Office Visit from 05/22/2022 in [facility] Office Visit from 03/18/2022 in [facility] Counselor from 07/17/2021 in [facility] Office Visit from 05/08/2021 in [facility]  Total GAD-7 Score 15 12 13 6 2       PHQ2-9    Flowsheet Row Office Visit from 05/30/2022 in [facility] Office Visit from 05/22/2022 in [facility] Office Visit from 03/18/2022 in [facility] Office Visit from 12/27/2021 in [facility] Office Visit from 11/08/2021 in [facility]  PHQ-2 Total Score 6 6 6  0 0  PHQ-9 Total Score 20 19 17  -- 3      Flowsheet Row Video Visit from 07/17/2021 in [facility] Most recent reading at 07/17/2021  4:19 PM Counselor from 07/17/2021 in [facility] Most recent reading at 07/17/2021 10:07 AM  C-SSRS RISK CATEGORY No Risk No Risk        Assessment and Plan:  OMER PUCCINELLI is a 31 y.o. year old male with a history of depression, anxiety, who presents for follow up appointment for below.     1. MDD (major depressive disorder), recurrent episode, moderate (HCC) Exam is notable for down affect, and there has been slight worsening in depressive symptoms since the last visit.  Psychosocial stressors includes work related stress, and break-up.  After having discussed treatment options, he is willing to try Abilify as adjunctive treatment for depression while working to improve medication adherence.  Discussed potential metabolic side effect, EPS and QTc prolongation.  Medication will be sent after obtaining EKG. will continue current dose of venlafaxine to target depression.  Noted that he prefers medication, which causes less weight gain.  Although TMS is discussed, it is not feasible due to his work schedule.  He will greatly benefit from CBT; he will have the first appointment next month.   2. Attention deficit hyperactivity disorder (ADHD), combined type, moderate Worsening in accordance with his mood symptoms. Neuropsychological testing was consistent with ADHD.  He has great benefit from frequent dose of Ritalin.  Will continue current dose of Ritalin to target ADHD.    Plan Continue Ritalin 20 mg twice a day - one refill left Continue venlafaxine 225 mg daily -monitor adherence Obtain EKG Start Abilify 2 mg at night after reviewing EKG  Next appointment: 12/21 a 2 PM for 30 mins, in person, video  [email or web address]   Past trials of medication: lexapro, fluoxetine, venlafaxine, bupropion, Ritalin (felt spacy), Concerta, Focalin, Adderall, Vyvanse, Strattera (he had some side effect from stimulant, including nausea.)   The  patient demonstrates the following risk factors for suicide: Chronic risk factors for suicide include: psychiatric disorder of depression . Acute risk factors for suicide include: N/A. Protective factors for this patient include: coping skills and hope for the future. Considering these factors, the overall suicide risk at this point appears to be low. Patient is appropriate for outpatient follow up.     Collaboration of Care: Collaboration of Care: Other reviewed notes in Epic  Patient/Guardian was advised Release of Information must be obtained prior to any record release in order to collaborate their care with an outside provider. Patient/Guardian was advised if they have not already done so to contact the registration department to sign all necessary forms in order for [place] to release information regarding their care.   Consent: Patient/Guardian gives verbal consent for treatment and assignment of benefits for services provided during this visit. Patient/Guardian expressed understanding and agreed to proceed.    Neysa Hotter, MD 05/30/2022, 2:38 PM

## 2022-05-29 NOTE — Patient Instructions (Signed)
Good to see you - Start meloxicam 15 mg daily x2 weeks.  If still having pain after 2 weeks, complete 3rd-week of meloxicam. May use remaining meloxicam as needed once daily for pain control.  Do not to use additional NSAIDs while taking meloxicam.  May use Tylenol [id number] mg 2 to 3 times a day for breakthrough pain. Recommend no rock climbing or upper body work out for 2 weeks but can gradually increase after the 2 weeks  4 week follow up

## 2022-05-30 ENCOUNTER — Ambulatory Visit (INDEPENDENT_AMBULATORY_CARE_PROVIDER_SITE_OTHER): Payer: BC Managed Care – PPO | Admitting: Psychiatry

## 2022-05-30 ENCOUNTER — Encounter: Payer: Self-pay | Admitting: Psychiatry

## 2022-05-30 VITALS — BP 143/78 | HR 90 | Temp 98.3°F | Ht 71.0 in | Wt 200.0 lb

## 2022-05-30 DIAGNOSIS — F902 Attention-deficit hyperactivity disorder, combined type: Secondary | ICD-10-CM | POA: Diagnosis not present

## 2022-05-30 DIAGNOSIS — F331 Major depressive disorder, recurrent, moderate: Secondary | ICD-10-CM | POA: Diagnosis not present

## 2022-05-30 MED ORDER — METHYLPHENIDATE HCL 20 MG PO TABS: 20.0000 mg | ORAL_TABLET | Freq: Two times a day (BID) | ORAL | 0 refills | Status: DC

## 2022-05-30 MED ORDER — VENLAFAXINE HCL ER 150 MG PO CP24: 150.0000 mg | ORAL_CAPSULE | Freq: Every day | ORAL | 0 refills | Status: DC

## 2022-05-30 NOTE — Patient Instructions (Signed)
Continue Ritalin 20 mg twice a day  Continue venlafaxine 225 mg daily  Obtain EKG Start Abilify 2 mg at night after reviewing EKG  Next appointment: 12/21 a 2 PM

## 2022-05-31 ENCOUNTER — Ambulatory Visit
Admission: RE | Admit: 2022-05-31 | Discharge: 2022-05-31 | Disposition: A | Discharge status: Home / Self Care | Payer: BC Managed Care – PPO | Source: Ambulatory Visit | Attending: Psychiatry | Admitting: Psychiatry

## 2022-05-31 ENCOUNTER — Other Ambulatory Visit: Payer: Self-pay | Admitting: Psychiatry

## 2022-05-31 DIAGNOSIS — I498 Other specified cardiac arrhythmias: Secondary | ICD-10-CM | POA: Diagnosis not present

## 2022-05-31 DIAGNOSIS — F331 Major depressive disorder, recurrent, moderate: Secondary | ICD-10-CM | POA: Diagnosis not present

## 2022-05-31 DIAGNOSIS — Z79899 Other long term (current) drug therapy: Secondary | ICD-10-CM

## 2022-05-31 MED ORDER — ARIPIPRAZOLE 2 MG PO TABS: 2.0000 mg | ORAL_TABLET | Freq: Every evening | ORAL | 1 refills | Status: DC

## 2022-06-12 DIAGNOSIS — F411 Generalized anxiety disorder: Secondary | ICD-10-CM | POA: Diagnosis not present

## 2022-06-12 DIAGNOSIS — F331 Major depressive disorder, recurrent, moderate: Secondary | ICD-10-CM | POA: Diagnosis not present

## 2022-06-20 DIAGNOSIS — F411 Generalized anxiety disorder: Secondary | ICD-10-CM | POA: Diagnosis not present

## 2022-06-20 DIAGNOSIS — F331 Major depressive disorder, recurrent, moderate: Secondary | ICD-10-CM | POA: Diagnosis not present

## 2022-06-22 ENCOUNTER — Other Ambulatory Visit: Payer: Self-pay | Admitting: Psychiatry

## 2022-06-25 NOTE — Progress Notes (Signed)
Craig Andrade D.[place] Phone: [phone]   Assessment and Plan:     1. Right hand pain  -Chronic with exacerbation, subsequent sports medicine visit - Continued unclear etiology of pain primarily located between second and third MCP.  Suspect the patient may have injured transverse metacarpal ligament during rockclimbing activities based on HPI and physical exam - Patient had improvement with avoidance of rockclimbing activity and meloxicam, however all of his pain returned after 1 rockclimbing activity - Recommend avoidance of painful activities including rockclimbing and weightlifting for the next 3 weeks - Discontinue meloxicam and may use remainder as needed - Start prednisone Dosepak  Pertinent previous records reviewed include none   Follow Up: Recommend that patient reintroduce rockclimbing in 3 weeks and then contact our clinic.  If pain returns, we could order a right hand MRI and have patient follow-up 3 days after for review of imaging.  If pain improves, as needed follow-up.   Subjective:   I, Craig Andrade, am serving as a Education administrator for Craig Andrade   Chief Complaint: right hand pain    HPI:    05/29/2022 Patient is a 32 year old male complaining of right hand pain. Patient states that he has pain when he rock climbs in between 2 and 3, decreased grip strength, pain when making a fist and pronation and supination, been going on 3-6 month , no meds for the pain, no numbness or tingling , no radiating pain,   06/26/2022 Patient states that the right had had a bout a week and a half of relief with the Meloxicam and then after rock climbing the pain came right back. Patient did take two weeks off and made sure not to use the right hand to pick anything up. He was really diligent but the pain is right back to where it was at the first visit.    Relevant Historical Information: None  pertinent  Additional pertinent review of systems negative.   Current Outpatient Medications:    ARIPiprazole (ABILIFY) 2 MG tablet, Take 1 tablet (2 mg total) by mouth every evening., Disp: 30 tablet, Rfl: 1   meloxicam (MOBIC) 15 MG tablet, Take 1 tablet (15 mg total) by mouth daily., Disp: 30 tablet, Rfl: 0   [START ON 06/29/2022] methylphenidate (RITALIN) 20 MG tablet, Take 1 tablet (20 mg total) by mouth 2 (two) times daily., Disp: 60 tablet, Rfl: 0   methylPREDNISolone (MEDROL DOSEPAK) 4 MG TBPK tablet, Take 6 tablets on day 1.  Take 5 tablets on day 2.  Take 4 tablets on day 3.  Take 3 tablets on day 4.  Take 2 tablets on day 5.  Take 1 tablet on day 6., Disp: 21 tablet, Rfl: 0   [START ON 07/08/2022] venlafaxine XR (EFFEXOR-XR) 150 MG 24 hr capsule, Take 1 capsule (150 mg total) by mouth daily., Disp: 90 capsule, Rfl: 0   venlafaxine XR (EFFEXOR-XR) 75 MG 24 hr capsule, Take 1 capsule (75 mg total) by mouth daily. Take total of 225 mg daily. Take along with 150 mg cap, Disp: 90 capsule, Rfl: 0   Objective:     Vitals:   06/26/22 0827  BP: 120/84  Pulse: 94  SpO2: 98%  Weight: 199 lb (90.3 kg)  Height: 5\' 11"  (1.803 m)      Body mass index is 27.75 kg/m.    Physical Exam:    General: Appears well, nad, nontoxic and  pleasant Neuro:sensation intact, strength is 5/5 with df/pf/inv/ev, muscle tone wnl Skin:no susupicious lesions or rashes   Right hand/wrist:  No deformity or swelling appreciated. Wrist ROM  Ext 90, flexion70, radial/ulnar deviation 30 Full active/passive extension/flexion of digits without tenderness Tenderness through second and third MCP with metacarpal compression No pain with metacarpal traction between second and third metacarpals nttp over the snuff box, dorsal carpals, volar carpals, radial styloid, ulnar styloid, 1st mcp, tfcc Negative Tinel's Negative finklestein Neg tfcc bounce test No pain with resisted ext, flex or deviation      Electronically signed by:  Craig Andrade D.[facility] 8:48 AM 06/26/22

## 2022-06-26 ENCOUNTER — Ambulatory Visit (INDEPENDENT_AMBULATORY_CARE_PROVIDER_SITE_OTHER): Payer: BC Managed Care – PPO | Admitting: Sports Medicine

## 2022-06-26 VITALS — BP 120/84 | HR 94 | Ht 71.0 in | Wt 199.0 lb

## 2022-06-26 DIAGNOSIS — M79641 Pain in right hand: Secondary | ICD-10-CM | POA: Diagnosis not present

## 2022-06-26 MED ORDER — METHYLPREDNISOLONE 4 MG PO TBPK: ORAL_TABLET | ORAL | 0 refills | Status: DC

## 2022-06-26 NOTE — Patient Instructions (Signed)
Good to see you Discontinue  meloxicam Prednisone dos pak  Avoid rock climbing and other painful activities for 3 weeks  After 3 weeks can gradually restart rock climbing  and let us know if pain returns so we can order an MRI  As needed if pain returns

## 2022-06-27 DIAGNOSIS — F411 Generalized anxiety disorder: Secondary | ICD-10-CM | POA: Diagnosis not present

## 2022-06-27 DIAGNOSIS — F331 Major depressive disorder, recurrent, moderate: Secondary | ICD-10-CM | POA: Diagnosis not present

## 2022-07-04 DIAGNOSIS — F331 Major depressive disorder, recurrent, moderate: Secondary | ICD-10-CM | POA: Diagnosis not present

## 2022-07-04 DIAGNOSIS — F411 Generalized anxiety disorder: Secondary | ICD-10-CM | POA: Diagnosis not present

## 2022-07-11 DIAGNOSIS — F331 Major depressive disorder, recurrent, moderate: Secondary | ICD-10-CM | POA: Diagnosis not present

## 2022-07-11 DIAGNOSIS — F411 Generalized anxiety disorder: Secondary | ICD-10-CM | POA: Diagnosis not present

## 2022-07-18 DIAGNOSIS — F411 Generalized anxiety disorder: Secondary | ICD-10-CM | POA: Diagnosis not present

## 2022-07-18 DIAGNOSIS — F331 Major depressive disorder, recurrent, moderate: Secondary | ICD-10-CM | POA: Diagnosis not present

## 2022-07-18 NOTE — Progress Notes (Signed)
[place] MD/PA/NP OP Progress Note  07/22/2022 2:06 PM DEWIN COONES  MRN:  [id number]  Chief Complaint:  Chief Complaint  Patient presents with   Follow-up   HPI:  This is a follow-up appointment for depression and ADHD.  He states that he has been doing better.  He has not felt depressed ("disappeared overnight").  He did not start Abilify as he was feeling better (he reportedly left a message to inquire this).  Although he cannot think about the reason of him feeling better, he thinks he is not caring as much about depression compared to before.  He has started to see a therapist, and learning skills for depression and anxiety.  He has not been able to do a rock climbing due to pain in his hand.  He is working on life improvement instead, such as working on things at home.  He enjoys going out with his friend.  He reports good focus most of the time , although he tends to forget to take medication twice a day.  The patient has mood symptoms as in PHQ-9/GAD-7.  He denies SI.  He denies alcohol use or drug use.  He feels available to stay on the medication as it is.   Wt Readings from Last 3 Encounters:  07/22/22 191 lb 9.6 oz (86.9 kg)  06/26/22 199 lb (90.3 kg)  05/30/22 200 lb (90.7 kg)    Visit Diagnosis:    ICD-10-CM   1. MDD (major depressive disorder), recurrent, in partial remission ([place])  F33.41     2. Attention deficit hyperactivity disorder (ADHD), combined type, moderate  F90.2       Past Psychiatric History: Please see initial evaluation for full details. I have reviewed the history. No updates at this time.     Past Medical History: History reviewed. No pertinent past medical history. History reviewed. No pertinent surgical history.  Family Psychiatric History: Please see initial evaluation for full details. I have reviewed the history. No updates at this time.     Family History:  Family History  Problem Relation Age of Onset   Depression Mother    Healthy Mother     Healthy Father    Anxiety disorder Brother     Social History:  Social History   Socioeconomic History   Marital status: Single    Spouse name: Not on file   Number of children: Not on file   Years of education: Not on file   Highest education level: Not on file  Occupational History   Not on file  Tobacco Use   Smoking status: Never   Smokeless tobacco: Never  Vaping Use   Vaping Use: Never used  Substance and Sexual Activity   Alcohol use: Yes    Alcohol/week: 1.0 standard drink of alcohol    Types: 1 Glasses of wine per week    Comment: drinks no more than 2 beers on occassion.    Drug use: Never    Comment: No illicit drug use   Sexual activity: Not Currently  Other Topics Concern   Not on file  Social History Narrative   Not on file   Social Determinants of Health   Financial Resource Strain: Low Risk  (07/17/2021)   Overall Financial Resource Strain (CARDIA)    Difficulty of Paying Living Expenses: Not hard at all  Food Insecurity: No Food Insecurity (07/17/2021)   Hunger Vital Sign    Worried About Running Out of Food in the Last Year: Never  true    Ran Out of Food in the Last Year: Never true  Transportation Needs: No Transportation Needs (07/17/2021)   PRAPARE - Hydrologist (Medical): No    Lack of Transportation (Non-Medical): No  Physical Activity: Sufficiently Active (07/17/2021)   Exercise Vital Sign    Days of Exercise per Week: 5 days    Minutes of Exercise per Session: 60 min  Stress: Stress Concern Present (07/17/2021)   [employer]    Feeling of Stress : To some extent  Social Connections: Socially Isolated (07/17/2021)   Social Connection and Isolation Panel [NHANES]    Frequency of Communication with Friends and Family: Once a week    Frequency of Social Gatherings with Friends and Family: Three times a week    Attends Religious Services: Never    Active  Member of Clubs or Organizations: No    Attends Archivist Meetings: Never    Marital Status: Never married    Allergies: No Known Allergies  Metabolic Disorder Labs: No results found for: "HGBA1C", "MPG" No results found for: "PROLACTIN" Lab Results  Component Value Date   CHOL 200 05/29/2022   TRIG 71.0 05/29/2022   HDL 56.50 05/29/2022   CHOLHDL 4 05/29/2022   VLDL 14.2 05/29/2022   LDLCALC 129 (H) 05/29/2022   LDLCALC 131 (H) 05/16/2021   Lab Results  Component Value Date   TSH 2.31 02/04/2019    Therapeutic Level Labs: No results found for: "LITHIUM" No results found for: "VALPROATE" No results found for: "CBMZ"  Current Medications: Current Outpatient Medications  Medication Sig Dispense Refill   ARIPiprazole (ABILIFY) 2 MG tablet Take 1 tablet (2 mg total) by mouth every evening. 30 tablet 1   methylphenidate (RITALIN) 20 MG tablet Take 1 tablet (20 mg total) by mouth 2 (two) times daily. 60 tablet 0   methylphenidate (RITALIN) 20 MG tablet Take 1 tablet (20 mg total) by mouth 2 (two) times daily. 60 tablet 0   [START ON 08/21/2022] methylphenidate (RITALIN) 20 MG tablet Take 1 tablet (20 mg total) by mouth 2 (two) times daily. 60 tablet 0   venlafaxine XR (EFFEXOR-XR) 150 MG 24 hr capsule Take 1 capsule (150 mg total) by mouth daily. 90 capsule 0   [START ON 08/03/2022] venlafaxine XR (EFFEXOR-XR) 75 MG 24 hr capsule Take 1 capsule (75 mg total) by mouth daily. Take total of 225 mg daily. Take along with 150 mg cap 90 capsule 0   No current facility-administered medications for this visit.     Musculoskeletal: Strength & Muscle Tone: within normal limits Gait & Station: normal Patient leans: N/A  Psychiatric Specialty Exam: Review of Systems  Psychiatric/Behavioral:  Negative for agitation, behavioral problems, confusion, decreased concentration, dysphoric mood, hallucinations, self-injury, sleep disturbance and suicidal ideas. The patient is  nervous/anxious. The patient is not hyperactive.   All other systems reviewed and are negative.   Blood pressure 123/79, pulse 75, temperature 97.8 F (36.6 C), temperature source Temporal, height 5' 11"$  (1.803 m), weight 191 lb 9.6 oz (86.9 kg), SpO2 97 %.Body mass index is 26.72 kg/m.  General Appearance: Well Groomed  Eye Contact:  Good  Speech:  Clear and Coherent  Volume:  Normal  Mood:   good  Affect:  Appropriate, Congruent, and calm  Thought Process:  Coherent  Orientation:  Full (Time, Place, and Person)  Thought Content: Logical   Suicidal Thoughts:  No  Homicidal  Thoughts:  No  Memory:  Immediate;   Good  Judgement:  Good  Insight:  Good  Psychomotor Activity:  Normal  Concentration:  Concentration: Good and Attention Span: Good  Recall:  Good  Fund of Knowledge: Good  Language: Good  Akathisia:  No  Handed:  Right  AIMS (if indicated): not done  Assets:  Communication Skills Desire for Improvement  ADL's:  Intact  Cognition: WNL  Sleep:  Fair   Screenings: GAD-7    Flowsheet Row Office Visit from 07/22/2022 in [place] Office Visit from 05/30/2022 in [place] Office Visit from 05/22/2022 in [place] at [facility] Office Visit from 03/18/2022 in [place] Counselor from 07/17/2021 in [facility]  Total GAD-7 Score 6 15 12 13 6      $ PHQ2-9    [place] Office Visit from 07/22/2022 in [place] Office Visit from 05/30/2022 in [place] Office Visit from 05/22/2022 in [place] at [facility] Visit from 03/18/2022 in [place] Office Visit from 12/27/2021 in [place] at [facility]  PHQ-2 Total  Score 2 6 6 6 $ 0  PHQ-9 Total Score 6 20 19 17 $ --      [place] Office Visit from 07/22/2022 in [place] Most recent reading at 07/22/2022  1:38 PM Video Visit from 07/17/2021 in [place] Most recent reading at 07/17/2021  4:19 PM Counselor from 07/17/2021 in [facility] Most recent reading at 07/17/2021 10:07 AM  C-SSRS RISK CATEGORY No Risk No Risk No Risk        Assessment and Plan:  FALLOU BAHL is a 32 y.o. year old male with a history of depression, anxiety, who presents for follow up appointment for below.    1. MDD (major depressive disorder), recurrent episode, in partial remission ([place]) Acute stressors include: work related stress  Other stressors include:    History:   There has been overall improvement in depressive symptoms and anxiety since the last visit despite him holding starting Abilify due to improvement in his mood.  Will continue venlafaxine to target depression.  He will continue to see his therapist.   2. Attention deficit hyperactivity disorder (ADHD), combined type, moderate -Neuropsychological testing in May 2023 was consistent with ADHD It has been overall improving as his mood improves.  Will continue current dose of Ritalin to target ADHD.  It's noted that he reports forgetfulness in taking the second dose later in the day, despite experiencing its effectiveness. He was offered the option to consider a long-acting medication in the future if he is interested.   Plan Continue venlafaxine 225 mg daily -monitor adherence Continue Ritalin 20 mg twice a day - one refill left Hold Abilify (he never took this medication) Next appointment: 4/9 at 8:30 for 30 mins, in person, video [email or web address] [facility],    Past trials of medication: lexapro, fluoxetine, venlafaxine, bupropion, Ritalin (felt spacy), Concerta, Focalin, Adderall,  Vyvanse, Strattera (he had some side effect from stimulant, including nausea.)   The patient demonstrates the following risk factors for suicide: Chronic risk factors for suicide include: psychiatric disorder of depression . Acute risk factors for suicide include: N/A. Protective factors for this patient include: coping skills and hope for  the future. Considering these factors, the overall suicide risk at this point appears to be low. Patient is appropriate for outpatient follow up.   Collaboration of Care: Collaboration of Care: Other reviewed notes in Epic  Patient/Guardian was advised Release of Information must be obtained prior to any record release in order to collaborate their care with an outside provider. Patient/Guardian was advised if they have not already done so to contact the registration department to sign all necessary forms in order for [place] to release information regarding their care.   Consent: Patient/Guardian gives verbal consent for treatment and assignment of benefits for services provided during this visit. Patient/Guardian expressed understanding and agreed to proceed.    Norman Clay, MD 07/22/2022, 2:06 PM

## 2022-07-22 ENCOUNTER — Encounter: Payer: Self-pay | Admitting: Psychiatry

## 2022-07-22 ENCOUNTER — Ambulatory Visit (INDEPENDENT_AMBULATORY_CARE_PROVIDER_SITE_OTHER): Payer: BC Managed Care – PPO | Admitting: Psychiatry

## 2022-07-22 VITALS — BP 123/79 | HR 75 | Temp 97.8°F | Ht 71.0 in | Wt 191.6 lb

## 2022-07-22 DIAGNOSIS — F902 Attention-deficit hyperactivity disorder, combined type: Secondary | ICD-10-CM | POA: Diagnosis not present

## 2022-07-22 DIAGNOSIS — F3341 Major depressive disorder, recurrent, in partial remission: Secondary | ICD-10-CM | POA: Diagnosis not present

## 2022-07-22 MED ORDER — METHYLPHENIDATE HCL 20 MG PO TABS: 20.0000 mg | ORAL_TABLET | Freq: Two times a day (BID) | ORAL | 0 refills | Status: DC

## 2022-07-22 MED ORDER — VENLAFAXINE HCL ER 75 MG PO CP24: 75.0000 mg | ORAL_CAPSULE | Freq: Every day | ORAL | 0 refills | Status: DC

## 2022-07-22 NOTE — Patient Instructions (Signed)
Continue venlafaxine 225 mg daily  Continue Ritalin 20 mg twice a day  Hold Abilify  Next appointment: 4/9 at 8:30

## 2022-08-01 DIAGNOSIS — F331 Major depressive disorder, recurrent, moderate: Secondary | ICD-10-CM | POA: Diagnosis not present

## 2022-08-01 DIAGNOSIS — F411 Generalized anxiety disorder: Secondary | ICD-10-CM | POA: Diagnosis not present

## 2022-08-15 DIAGNOSIS — F411 Generalized anxiety disorder: Secondary | ICD-10-CM | POA: Diagnosis not present

## 2022-08-15 DIAGNOSIS — F331 Major depressive disorder, recurrent, moderate: Secondary | ICD-10-CM | POA: Diagnosis not present

## 2022-08-25 ENCOUNTER — Other Ambulatory Visit: Payer: Self-pay | Admitting: Psychiatry

## 2022-08-25 DIAGNOSIS — F331 Major depressive disorder, recurrent, moderate: Secondary | ICD-10-CM

## 2022-08-29 DIAGNOSIS — F411 Generalized anxiety disorder: Secondary | ICD-10-CM | POA: Diagnosis not present

## 2022-08-29 DIAGNOSIS — F331 Major depressive disorder, recurrent, moderate: Secondary | ICD-10-CM | POA: Diagnosis not present

## 2022-09-12 NOTE — Progress Notes (Signed)
Virtual Visit via Video Note  I connected with Craig Andrade on 09/17/22 at  8:30 AM EDT by a video enabled telemedicine application and verified that I am speaking with the correct person using two identifiers.  Location: Patient: home Provider: office Persons participated in the visit- patient, provider    I discussed the limitations of evaluation and management by telemedicine and the availability of in person appointments. The patient expressed understanding and agreed to proceed.      I discussed the assessment and treatment plan with the patient. The patient was provided an opportunity to ask questions and all were answered. The patient agreed with the plan and demonstrated an understanding of the instructions.   The patient was advised to call back or seek an in-person evaluation if the symptoms worsen or if the condition fails to improve as anticipated.  I provided 26 minutes of non-face-to-face time during this encounter.   Neysa Hotter, MD    [facility] MD/PA/NP OP Progress Note  09/17/2022 9:25 AM Craig Andrade  MRN:  [id number]  Chief Complaint:  Chief Complaint  Patient presents with   Follow-up   HPI:  This is a follow-up appointment for depression and ADHD.  He states that he has been doing good.  He is unsure whether he is going through a midlife crisis or feeling depressed.  He is thinking about what he is doing in his life, and long-term plans.  He is unsatisfied with work.  Although he had job interviews, he has not found the one yet.  Although he is hoping to have a partner, he want to make sure he has stability in his mental health.  He is not sure whether it is him making any excuse.  He thinks he has been the same compared to the time he was doing good.  He has been back to rock climbing, and is enjoying being around with others.  He tends to be bored at work as things are slowing down. He has been able to complete tasks at work.  Although he has been trying to  take venlafaxine consistently, he consume is on weekends.  He would like to make change, and would like to try fluoxetine at this time.  He has middle insomnia with snoring.  He feels more fatigued lately.  He denies SI.  He denies alcohol use or drug use.    Visit Diagnosis:    ICD-10-CM   1. MDD (major depressive disorder), recurrent, in partial remission  F33.41     2. Attention deficit hyperactivity disorder (ADHD), combined type, moderate  F90.2     3. Insomnia, unspecified type  G47.00 Ambulatory referral to Pulmonology      Past Psychiatric History: Please see initial evaluation for full details. I have reviewed the history. No updates at this time.     Past Medical History: No past medical history on file. No past surgical history on file.  Family Psychiatric History: Please see initial evaluation for full details. I have reviewed the history. No updates at this time.     Family History:  Family History  Problem Relation Age of Onset   Depression Mother    Healthy Mother    Healthy Father    Anxiety disorder Brother     Social History:  Social History   Socioeconomic History   Marital status: Single    Spouse name: Not on file   Number of children: Not on file   Years of education: Not  on file   Highest education level: Not on file  Occupational History   Not on file  Tobacco Use   Smoking status: Never   Smokeless tobacco: Never  Vaping Use   Vaping Use: Never used  Substance and Sexual Activity   Alcohol use: Yes    Alcohol/week: 1.0 standard drink of alcohol    Types: 1 Glasses of wine per week    Comment: drinks no more than 2 beers on occassion.    Drug use: Never    Comment: No illicit drug use   Sexual activity: Not Currently  Other Topics Concern   Not on file  Social History Narrative   Not on file   Social Determinants of Health   Financial Resource Strain: Low Risk  (07/17/2021)   Overall Financial Resource Strain (CARDIA)    Difficulty  of Paying Living Expenses: Not hard at all  Food Insecurity: No Food Insecurity (07/17/2021)   Hunger Vital Sign    Worried About Running Out of Food in the Last Year: Never true    Ran Out of Food in the Last Year: Never true  Transportation Needs: No Transportation Needs (07/17/2021)   PRAPARE - Administrator, Civil Service (Medical): No    Lack of Transportation (Non-Medical): No  Physical Activity: Sufficiently Active (07/17/2021)   Exercise Vital Sign    Days of Exercise per Week: 5 days    Minutes of Exercise per Session: 60 min  Stress: Stress Concern Present (07/17/2021)   [employer] of Occupational Health - Occupational Stress Questionnaire    Feeling of Stress : To some extent  Social Connections: Socially Isolated (07/17/2021)   Social Connection and Isolation Panel [NHANES]    Frequency of Communication with Friends and Family: Once a week    Frequency of Social Gatherings with Friends and Family: Three times a week    Attends Religious Services: Never    Active Member of Clubs or Organizations: No    Attends Banker Meetings: Never    Marital Status: Never married    Allergies: No Known Allergies  Metabolic Disorder Labs: No results found for: "HGBA1C", "MPG" No results found for: "PROLACTIN" Lab Results  Component Value Date   CHOL 200 05/29/2022   TRIG 71.0 05/29/2022   HDL 56.50 05/29/2022   CHOLHDL 4 05/29/2022   VLDL 14.2 05/29/2022   LDLCALC 129 (H) 05/29/2022   LDLCALC 131 (H) 05/16/2021   Lab Results  Component Value Date   TSH 2.31 02/04/2019    Therapeutic Level Labs: No results found for: "LITHIUM" No results found for: "VALPROATE" No results found for: "CBMZ"  Current Medications: Current Outpatient Medications  Medication Sig Dispense Refill   FLUoxetine (PROZAC) 20 MG capsule Take 1 capsule (20 mg total) by mouth daily for 7 days. 7 capsule 0   [START ON 09/24/2022] FLUoxetine (PROZAC) 40 MG capsule Take 1 capsule  (40 mg total) by mouth daily. Start after completing 20 mg daily for one week 30 capsule 1   [START ON 10/17/2022] methylphenidate (RITALIN) 20 MG tablet Take 1 tablet (20 mg total) by mouth 2 (two) times daily. 60 tablet 0   ARIPiprazole (ABILIFY) 2 MG tablet Take 1 tablet (2 mg total) by mouth every evening. 30 tablet 1   methylphenidate (RITALIN) 20 MG tablet Take 1 tablet (20 mg total) by mouth 2 (two) times daily. 60 tablet 0   venlafaxine XR (EFFEXOR-XR) 150 MG 24 hr capsule Take 1 capsule (150  mg total) by mouth daily. 90 capsule 0   venlafaxine XR (EFFEXOR-XR) 75 MG 24 hr capsule Take 1 capsule (75 mg total) by mouth daily. Take total of 225 mg daily. Take along with 150 mg cap 90 capsule 0   No current facility-administered medications for this visit.     Musculoskeletal: Strength & Muscle Tone: within normal limits Gait & Station: normal Patient leans: N/A  Psychiatric Specialty Exam: Review of Systems  Psychiatric/Behavioral:  Negative for agitation, behavioral problems, confusion, decreased concentration, dysphoric mood and hallucinations. The patient is not hyperactive.   All other systems reviewed and are negative.   There were no vitals taken for this visit.There is no height or weight on file to calculate BMI.  General Appearance: Fairly Groomed  Eye Contact:  Good  Speech:  Clear and Coherent  Volume:  Normal  Mood:   tired  Affect:  Appropriate, Congruent, and fatigue  Thought Process:  Coherent  Orientation:  Full (Time, Place, and Person)  Thought Content: Logical   Suicidal Thoughts:  No  Homicidal Thoughts:  No  Memory:  Immediate;   Good  Judgement:  Good  Insight:  Good  Psychomotor Activity:  Normal  Concentration:  Concentration: Good and Attention Span: Good  Recall:  Good  Fund of Knowledge: Good  Language: Good  Akathisia:  No  Handed:  Right  AIMS (if indicated): not done  Assets:  Communication Skills Desire for Improvement  ADL's:  Intact   Cognition: WNL  Sleep:  Poor   Screenings: GAD-7    Flowsheet Row Office Visit from 07/22/2022 in [facility] Office Visit from 05/30/2022 in [facility] Office Visit from 05/22/2022 in [facility] Office Visit from 03/18/2022 in [facility] Counselor from 07/17/2021 in [facility]  Total GAD-7 Score 6 15 12 13 6       PHQ2-9    Flowsheet Row Office Visit from 07/22/2022 in [facility] Office Visit from 05/30/2022 in [facility] Office Visit from 05/22/2022 in [facility] Office Visit from 03/18/2022 in [facility] Office Visit from 12/27/2021 in [facility]  PHQ-2 Total Score 2 6 6 6  0  PHQ-9 Total Score 6 20 19 17  --      Flowsheet Row Office Visit from 07/22/2022 in [facility] Most recent reading at 07/22/2022  1:38 PM Video Visit from 07/17/2021 in [facility] Most recent reading at 07/17/2021  4:19 PM Counselor from 07/17/2021 in [facility] Most recent reading at 07/17/2021 10:07 AM  C-SSRS RISK CATEGORY No Risk No Risk No Risk        Assessment and Plan:  Craig Andrade is a 32 y.o. year old male with a history of depression, anxiety, who presents for follow up appointment for below.   1. MDD (major depressive disorder), recurrent, in partial remission Acute stressors include: work related stress  Other stressors include:    History:   Although he reports overall improvement in depressive symptoms and anxiety, he has fatigue, and has struggle with adherence to medication despite his effort.   Will switch from venlafaxine to fluoxetine to minimize the impact from non adherence.  Discussed potential risk of serotonin syndrome, and discontinuation symptoms  from venlafaxine.   2. Attention deficit hyperactivity disorder (ADHD), combined type, moderate -Neuropsychological testing in May 2023 was consistent with ADHD Unchanged.  Will continue current dose of Ritalin to target ADHD.   # Insomnia He reports middle insomnia, daytime fatigue and snoring.  Will make referral for evaluation of sleep apnea.    Plan Reduce venlafaxine by 75 mg per week until complete cessation. Start fluoxetine 20 mg daily for one week, then 40 mg daily  Continue Ritalin 20 mg twice a day - one refill left Next appointment: 6/4 at 10 AM for 30 mins, in person, video [email or web address] Referral for evaluation of sleep apnea   Past trials of medication: lexapro, fluoxetine, venlafaxine, bupropion, Ritalin (felt spacy), Concerta, Focalin, Adderall, Vyvanse, Strattera (he had some side effect from stimulant, including nausea.)   The patient demonstrates the following risk factors for suicide: Chronic risk factors for suicide include: psychiatric disorder of depression . Acute risk factors for suicide include: N/A. Protective factors for this patient include: coping skills and hope for the future. Considering these factors, the overall suicide risk at this point appears to be low. Patient is appropriate for outpatient follow up.   Collaboration of Care: Collaboration of Care: Other reviewed notes in Epic  Patient/Guardian was advised Release of Information must be obtained prior to any record release in order to collaborate their care with an outside provider. Patient/Guardian was advised if they have not already done so to contact the registration department to sign all necessary forms in order for [place] to release information regarding their care.   Consent: Patient/Guardian gives verbal consent for treatment and  assignment of benefits for services provided during this visit. Patient/Guardian expressed understanding and agreed to proceed.    Neysa Hotter, MD 09/17/2022, 9:25 AM

## 2022-09-17 ENCOUNTER — Telehealth (INDEPENDENT_AMBULATORY_CARE_PROVIDER_SITE_OTHER): Payer: BC Managed Care – PPO | Admitting: Psychiatry

## 2022-09-17 ENCOUNTER — Encounter: Payer: Self-pay | Admitting: Psychiatry

## 2022-09-17 DIAGNOSIS — F902 Attention-deficit hyperactivity disorder, combined type: Secondary | ICD-10-CM

## 2022-09-17 DIAGNOSIS — F3341 Major depressive disorder, recurrent, in partial remission: Secondary | ICD-10-CM | POA: Diagnosis not present

## 2022-09-17 DIAGNOSIS — G4700 Insomnia, unspecified: Secondary | ICD-10-CM | POA: Diagnosis not present

## 2022-09-17 MED ORDER — FLUOXETINE HCL 20 MG PO CAPS: 20.0000 mg | ORAL_CAPSULE | Freq: Every day | ORAL | 0 refills | Status: DC

## 2022-09-17 MED ORDER — METHYLPHENIDATE HCL 20 MG PO TABS: 20.0000 mg | ORAL_TABLET | Freq: Two times a day (BID) | ORAL | 0 refills | Status: DC

## 2022-09-17 MED ORDER — FLUOXETINE HCL 40 MG PO CAPS: 40.0000 mg | ORAL_CAPSULE | Freq: Every day | ORAL | 1 refills | Status: DC

## 2022-09-17 NOTE — Patient Instructions (Signed)
Reduce venlafaxine by 75 mg per week until complete cessation. Start fluoxetine 20 mg daily for one week, then 40 mg daily  Continue Ritalin 20 mg twice a day  Next appointment: 6/4 at 10 AM

## 2022-09-19 ENCOUNTER — Telehealth: Payer: Self-pay | Admitting: Sports Medicine

## 2022-09-19 NOTE — Telephone Encounter (Signed)
Patient called stating that he is still having issues with his right hand. When he was here to see Dr Jean Rosenthal last he told the patient that if it did not improve he may want to order some more imaging. Patient asked if we could proceed with MRI or whatever Dr Jean Rosenthal suggests?  Please advise.

## 2022-09-20 ENCOUNTER — Other Ambulatory Visit: Payer: Self-pay | Admitting: Sports Medicine

## 2022-09-20 DIAGNOSIS — M79641 Pain in right hand: Secondary | ICD-10-CM

## 2022-09-20 NOTE — Telephone Encounter (Signed)
MRI wo contrast placed to [facility]

## 2022-09-20 NOTE — Progress Notes (Unsigned)
MRI without contrast for right hand

## 2022-09-21 ENCOUNTER — Ambulatory Visit (INDEPENDENT_AMBULATORY_CARE_PROVIDER_SITE_OTHER): Payer: BC Managed Care – PPO

## 2022-09-21 DIAGNOSIS — M79641 Pain in right hand: Secondary | ICD-10-CM | POA: Diagnosis not present

## 2022-09-21 DIAGNOSIS — M65841 Other synovitis and tenosynovitis, right hand: Secondary | ICD-10-CM | POA: Diagnosis not present

## 2022-09-27 NOTE — Progress Notes (Deleted)
    Craig Andrade D.[facility] [address] [place] [address] Phone: [phone]   Assessment and Plan:     There are no diagnoses linked to this encounter.  ***   Pertinent previous records reviewed include ***   Follow Up: ***     Subjective:   I, Craig Andrade, am serving as a Neurosurgeon for Doctor Richardean Sale   Chief Complaint: right hand pain    HPI:    05/29/2022 Patient is a 32 year old male complaining of right hand pain. Patient states that he has pain when he rock climbs in between 2 and 3, decreased grip strength, pain when making a fist and pronation and supination, been going on 3-6 month , no meds for the pain, no numbness or tingling , no radiating pain,    06/26/2022 Patient states that the right had had a bout a week and a half of relief with the Meloxicam and then after rock climbing the pain came right back. Patient did take two weeks off and made sure not to use the right hand to pick anything up. He was really diligent but the pain is right back to where it was at the first visit.  09/30/2022 Patient states    Relevant Historical Information: None pertinent    Additional pertinent review of systems negative.   Current Outpatient Medications:    ARIPiprazole (ABILIFY) 2 MG tablet, Take 1 tablet (2 mg total) by mouth every evening., Disp: 30 tablet, Rfl: 1   FLUoxetine (PROZAC) 20 MG capsule, Take 1 capsule (20 mg total) by mouth daily for 7 days., Disp: 7 capsule, Rfl: 0   FLUoxetine (PROZAC) 40 MG capsule, Take 1 capsule (40 mg total) by mouth daily. Start after completing 20 mg daily for one week, Disp: 30 capsule, Rfl: 1   methylphenidate (RITALIN) 20 MG tablet, Take 1 tablet (20 mg total) by mouth 2 (two) times daily., Disp: 60 tablet, Rfl: 0   [START ON 10/17/2022] methylphenidate (RITALIN) 20 MG tablet, Take 1 tablet (20 mg total) by mouth 2 (two) times daily., Disp: 60 tablet, Rfl: 0   venlafaxine XR  (EFFEXOR-XR) 150 MG 24 hr capsule, Take 1 capsule (150 mg total) by mouth daily., Disp: 90 capsule, Rfl: 0   venlafaxine XR (EFFEXOR-XR) 75 MG 24 hr capsule, Take 1 capsule (75 mg total) by mouth daily. Take total of 225 mg daily. Take along with 150 mg cap, Disp: 90 capsule, Rfl: 0   Objective:     There were no vitals filed for this visit.    There is no height or weight on file to calculate BMI.    Physical Exam:    ***   Electronically signed by:  Craig Andrade [facility] 7:19 AM 09/27/22

## 2022-09-30 ENCOUNTER — Ambulatory Visit: Payer: BC Managed Care – PPO | Admitting: Sports Medicine

## 2022-10-01 NOTE — Progress Notes (Unsigned)
    Aleen Sells D.[facility] [address] [place] [address] Phone: [phone]   Assessment and Plan:     There are no diagnoses linked to this encounter.  ***   Pertinent previous records reviewed include ***   Follow Up: ***     Subjective:   I, Tiago Humphrey, am serving as a Neurosurgeon for Doctor Richardean Sale   Chief Complaint: right hand pain    HPI:    05/29/2022 Patient is a 32 year old male complaining of right hand pain. Patient states that he has pain when he rock climbs in between 2 and 3, decreased grip strength, pain when making a fist and pronation and supination, been going on 3-6 month , no meds for the pain, no numbness or tingling , no radiating pain,    06/26/2022 Patient states that the right had had a bout a week and a half of relief with the Meloxicam and then after rock climbing the pain came right back. Patient did take two weeks off and made sure not to use the right hand to pick anything up. He was really diligent but the pain is right back to where it was at the first visit.   10/02/2022 Patient states    Relevant Historical Information: None pertinent  Additional pertinent review of systems negative.   Current Outpatient Medications:    ARIPiprazole (ABILIFY) 2 MG tablet, Take 1 tablet (2 mg total) by mouth every evening., Disp: 30 tablet, Rfl: 1   FLUoxetine (PROZAC) 20 MG capsule, Take 1 capsule (20 mg total) by mouth daily for 7 days., Disp: 7 capsule, Rfl: 0   FLUoxetine (PROZAC) 40 MG capsule, Take 1 capsule (40 mg total) by mouth daily. Start after completing 20 mg daily for one week, Disp: 30 capsule, Rfl: 1   methylphenidate (RITALIN) 20 MG tablet, Take 1 tablet (20 mg total) by mouth 2 (two) times daily., Disp: 60 tablet, Rfl: 0   [START ON 10/17/2022] methylphenidate (RITALIN) 20 MG tablet, Take 1 tablet (20 mg total) by mouth 2 (two) times daily., Disp: 60 tablet, Rfl: 0   venlafaxine XR  (EFFEXOR-XR) 150 MG 24 hr capsule, Take 1 capsule (150 mg total) by mouth daily., Disp: 90 capsule, Rfl: 0   venlafaxine XR (EFFEXOR-XR) 75 MG 24 hr capsule, Take 1 capsule (75 mg total) by mouth daily. Take total of 225 mg daily. Take along with 150 mg cap, Disp: 90 capsule, Rfl: 0   Objective:     There were no vitals filed for this visit.    There is no height or weight on file to calculate BMI.    Physical Exam:    ***   Electronically signed by:  Aleen Sells [facility] 4:12 PM 10/01/22

## 2022-10-02 ENCOUNTER — Ambulatory Visit (INDEPENDENT_AMBULATORY_CARE_PROVIDER_SITE_OTHER): Payer: BC Managed Care – PPO | Admitting: Sports Medicine

## 2022-10-02 VITALS — BP 122/80 | HR 87 | Ht 71.0 in | Wt 199.0 lb

## 2022-10-02 DIAGNOSIS — M79641 Pain in right hand: Secondary | ICD-10-CM | POA: Diagnosis not present

## 2022-10-02 MED ORDER — CELECOXIB 200 MG PO CAPS: 200.0000 mg | ORAL_CAPSULE | Freq: Two times a day (BID) | ORAL | 0 refills | Status: AC

## 2022-10-02 NOTE — Patient Instructions (Addendum)
Good to see you Celebrex 200 mg 2x a day for 3 weeks  Recommend no climbing or other physical activities involving han grips for 4 weeks and then gradual re introduction into activities a tolerated  As needed follow up , 6-8 weeks if no improvement

## 2022-10-11 ENCOUNTER — Other Ambulatory Visit: Payer: Self-pay | Admitting: Psychiatry

## 2022-10-17 ENCOUNTER — Telehealth: Payer: Self-pay

## 2022-10-17 NOTE — Telephone Encounter (Signed)
Per the patient's chart, he called back and rescheduled his appointment.   Nothing further needed.

## 2022-10-17 NOTE — Telephone Encounter (Signed)
ATC the patient. LVM for the patient to return my call. I need to know if he has had a sleep study in the past or if he is currently on a CPAP.

## 2022-10-18 ENCOUNTER — Institutional Professional Consult (permissible substitution): Payer: BC Managed Care – PPO | Admitting: Nurse Practitioner

## 2022-11-04 NOTE — Progress Notes (Addendum)
BH MD/PA/NP OP Progress Note  11/12/2022 10:39 AM Craig Andrade  MRN:  [id number]  Chief Complaint:  Chief Complaint  Patient presents with   Follow-up   HPI:  This is a follow-up appointment for depression, ADHD.  He states that he is not doing well.  He has difficulty in focus, and it does not want to do anything.  He feels that he is just existing.  He lies in the bed, and scrolls his phone on weekend.  He has difficulty at work due to concentration.  He could not go out for his friend's birthday party as he was feeling very anxious even just the thought of going there.  He has not done rock climbing.  He agrees to at least go outside regularly with the hope to get back on rock climbing.  He sleeps well.  The patient has mood symptoms as in PHQ-9/GAD-7.  He denies SI.  He has been able to take fluoxetine consistently.  In retrospect, he thinks he was just resenting the fact that he needed to take medication as he was able to do things regularly on other tasks such as going to work. Although he does not like it, it is better for him to take medication rather than feeling this way. He stopped going to see a therapist. He was laughed at when he shared his thought about hypothetical situation. Although he wants to see a therapist in person, it has been difficult. He is willing to get a referral. He denies alcohol use, drug use, stating that he has "no coping mechanism."  Wt Readings from Last 3 Encounters:  11/12/22 197 lb 6.4 oz (89.5 kg)  10/02/22 199 lb (90.3 kg)  07/22/22 191 lb 9.6 oz (86.9 kg)     Visit Diagnosis:    ICD-10-CM   1. MDD (major depressive disorder), recurrent episode, moderate (HCC)  F33.1     2. Attention deficit hyperactivity disorder (ADHD), combined type, moderate  F90.2       Past Psychiatric History: Please see initial evaluation for full details. I have reviewed the history. No updates at this time.     Past Medical History: History reviewed. No pertinent  past medical history. History reviewed. No pertinent surgical history.  Family Psychiatric History: Please see initial evaluation for full details. I have reviewed the history. No updates at this time.     Family History:  Family History  Problem Relation Age of Onset   Depression Mother    Healthy Mother    Healthy Father    Anxiety disorder Brother     Social History:  Social History   Socioeconomic History   Marital status: Single    Spouse name: Not on file   Number of children: Not on file   Years of education: Not on file   Highest education level: Not on file  Occupational History   Not on file  Tobacco Use   Smoking status: Never   Smokeless tobacco: Never  Vaping Use   Vaping Use: Never used  Substance and Sexual Activity   Alcohol use: Yes    Alcohol/week: 1.0 standard drink of alcohol    Types: 1 Glasses of wine per week    Comment: drinks no more than 2 beers on occassion.    Drug use: Never    Comment: No illicit drug use   Sexual activity: Not Currently  Other Topics Concern   Not on file  Social History Narrative   Not on  file   Social Determinants of Health   Financial Resource Strain: Low Risk  (07/17/2021)   Overall Financial Resource Strain (CARDIA)    Difficulty of Paying Living Expenses: Not hard at all  Food Insecurity: No Food Insecurity (07/17/2021)   Hunger Vital Sign    Worried About Running Out of Food in the Last Year: Never true    Ran Out of Food in the Last Year: Never true  Transportation Needs: No Transportation Needs (07/17/2021)   PRAPARE - Administrator, Civil Service (Medical): No    Lack of Transportation (Non-Medical): No  Physical Activity: Sufficiently Active (07/17/2021)   Exercise Vital Sign    Days of Exercise per Week: 5 days    Minutes of Exercise per Session: 60 min  Stress: Stress Concern Present (07/17/2021)   [employer] of Occupational Health - Occupational Stress Questionnaire    Feeling of  Stress : To some extent  Social Connections: Socially Isolated (07/17/2021)   Social Connection and Isolation Panel [NHANES]    Frequency of Communication with Friends and Family: Once a week    Frequency of Social Gatherings with Friends and Family: Three times a week    Attends Religious Services: Never    Active Member of Clubs or Organizations: No    Attends Banker Meetings: Never    Marital Status: Never married    Allergies: No Known Allergies  Metabolic Disorder Labs: No results found for: "HGBA1C", "MPG" No results found for: "PROLACTIN" Lab Results  Component Value Date   CHOL 200 05/29/2022   TRIG 71.0 05/29/2022   HDL 56.50 05/29/2022   CHOLHDL 4 05/29/2022   VLDL 14.2 05/29/2022   LDLCALC 129 (H) 05/29/2022   LDLCALC 131 (H) 05/16/2021   Lab Results  Component Value Date   TSH 2.31 02/04/2019    Therapeutic Level Labs: No results found for: "LITHIUM" No results found for: "VALPROATE" No results found for: "CBMZ"  Current Medications: Current Outpatient Medications  Medication Sig Dispense Refill   celecoxib (CELEBREX) 200 MG capsule Take 1 capsule (200 mg total) by mouth 2 (two) times daily. 42 capsule 0   FLUoxetine (PROZAC) 40 MG capsule Take 1 capsule (40 mg total) by mouth daily. 90 capsule 0   methylphenidate (RITALIN) 20 MG tablet Take 1 tablet (20 mg total) by mouth 2 (two) times daily. 60 tablet 0   ARIPiprazole (ABILIFY) 2 MG tablet Take 1 tablet (2 mg total) by mouth every evening. 30 tablet 1   FLUoxetine (PROZAC) 20 MG capsule Take 1 capsule (20 mg total) by mouth daily for 7 days. 7 capsule 0   methylphenidate (RITALIN) 20 MG tablet Take 1 tablet (20 mg total) by mouth 2 (two) times daily. 60 tablet 0   venlafaxine XR (EFFEXOR-XR) 150 MG 24 hr capsule Take 1 capsule (150 mg total) by mouth daily. 90 capsule 0   venlafaxine XR (EFFEXOR-XR) 75 MG 24 hr capsule Take 1 capsule (75 mg total) by mouth daily. Take total of 225 mg daily. Take  along with 150 mg cap 90 capsule 0   No current facility-administered medications for this visit.     Musculoskeletal: Strength & Muscle Tone: within normal limits Gait & Station: normal Patient leans: N/A  Psychiatric Specialty Exam: Review of Systems  Psychiatric/Behavioral:  Positive for decreased concentration, dysphoric mood and sleep disturbance. Negative for agitation, behavioral problems, confusion, hallucinations, self-injury and suicidal ideas. The patient is nervous/anxious. The patient is not hyperactive.  All other systems reviewed and are negative.   Blood pressure 135/85, pulse 67, temperature 98 F (36.7 C), temperature source Skin, height 5\' 11"  (1.803 m), weight 197 lb 6.4 oz (89.5 kg).Body mass index is 27.53 kg/m.  General Appearance: Fairly Groomed  Eye Contact:  Good  Speech:  Clear and Coherent  Volume:  Normal  Mood:  Depressed  Affect:  Appropriate, Congruent, and down  Thought Process:  Coherent  Orientation:  Full (Time, Place, and Person)  Thought Content: Logical   Suicidal Thoughts:  No  Homicidal Thoughts:  No  Memory:  Immediate;   Good  Judgement:  Good  Insight:  Good  Psychomotor Activity:  Normal  Concentration:  Concentration: Good and Attention Span: Good  Recall:  Good  Fund of Knowledge: Good  Language: Good  Akathisia:  No  Handed:  Right  AIMS (if indicated): not done  Assets:  Communication Skills Desire for Improvement  ADL's:  Intact  Cognition: WNL  Sleep:  Good   Screenings: GAD-7    Flowsheet Row Office Visit from 11/12/2022 in [facility] Office Visit from 07/22/2022 in [facility] Office Visit from 05/30/2022 in [facility] Office Visit from 05/22/2022 in [facility] Office Visit from 03/18/2022 in [facility]  Total  GAD-7 Score 16 6 15 12 13       PHQ2-9    Flowsheet Row Office Visit from 11/12/2022 in [facility] Office Visit from 07/22/2022 in [facility] Office Visit from 05/30/2022 in [facility] Office Visit from 05/22/2022 in [facility] Office Visit from 03/18/2022 in [facility]  PHQ-2 Total Score 6 2 6 6 6   PHQ-9 Total Score 20 6 20 19 17       Flowsheet Row Office Visit from 07/22/2022 in [facility] Most recent reading at 07/22/2022  1:38 PM Video Visit from 07/17/2021 in [facility] Most recent reading at 07/17/2021  4:19 PM Counselor from 07/17/2021 in [facility] Most recent reading at 07/17/2021 10:07 AM  C-SSRS RISK CATEGORY No Risk No Risk No Risk        Assessment and Plan:  Craig Andrade is a 32 y.o. year old male with a history of depression, anxiety, who presents for follow up appointment for below.   1. MDD (major depressive disorder), recurrent episode, moderate (HCC) Acute stressors include: work related stress  Other stressors include:    History:   There has been significant worsening in depressive symptoms since cross tapering from venlafaxine to fluoxetine.  He is now waiting to improve adherence, and to be back on venlafaxine given it was more effective.  Coached behavioral activation.  He will greatly benefit from CBT.  He is hoping to transfer the care due to issues with his previous therapist.  Will make a referral on site.  2. Attention deficit hyperactivity disorder (ADHD), combined type, moderate -Neuropsychological testing in May 2023 was consistent with ADHD  Significant worsening in the context of worsening in his mood symptoms as described.  Will continue  current dose of Ritalin to target ADHD.   # Insomnia He reports middle insomnia, daytime fatigue and snoring.  Will make referral for evaluation of sleep apnea.  Plan Start venlafaxine 75 mg daily for one week, then 150 mg daily for one week, then 225 mg daily   Discontinue fluoxetine Continue Ritalin 20 mg twice a day - two refills left Next appointment: 7/25 at 10:30 for 30 mins, in person, video [email or web address] Referral to therapy (onsite) Referred for evaluation of sleep apnea   Past trials of medication: lexapro, fluoxetine (limited benefit), venlafaxine, bupropion, Ritalin (felt spacy), Concerta, Focalin, Adderall, Vyvanse, Strattera (he had some side effect from stimulant, including nausea.)   The patient demonstrates the following risk factors for suicide: Chronic risk factors for suicide include: psychiatric disorder of depression . Acute risk factors for suicide include: N/A. Protective factors for this patient include: coping skills and hope for the future. Considering these factors, the overall suicide risk at this point appears to be low. Patient is appropriate for outpatient follow up.   Collaboration of Care: Collaboration of Care: Other reviewed notes in Epic  Patient/Guardian was advised Release of Information must be obtained prior to any record release in order to collaborate their care with an outside provider. Patient/Guardian was advised if they have not already done so to contact the registration department to sign all necessary forms in order for [place] to release information regarding their care.   Consent: Patient/Guardian gives verbal consent for treatment and assignment of benefits for services provided during this visit. Patient/Guardian expressed understanding and agreed to proceed.    Neysa Hotter, MD 11/12/2022, 10:39 AM

## 2022-11-06 ENCOUNTER — Institutional Professional Consult (permissible substitution): Payer: BC Managed Care – PPO | Admitting: Nurse Practitioner

## 2022-11-12 ENCOUNTER — Encounter: Payer: Self-pay | Admitting: Psychiatry

## 2022-11-12 ENCOUNTER — Ambulatory Visit (INDEPENDENT_AMBULATORY_CARE_PROVIDER_SITE_OTHER): Payer: BC Managed Care – PPO | Admitting: Psychiatry

## 2022-11-12 VITALS — BP 135/85 | HR 67 | Temp 98.0°F | Ht 71.0 in | Wt 197.4 lb

## 2022-11-12 DIAGNOSIS — F902 Attention-deficit hyperactivity disorder, combined type: Secondary | ICD-10-CM | POA: Diagnosis not present

## 2022-11-12 DIAGNOSIS — F331 Major depressive disorder, recurrent, moderate: Secondary | ICD-10-CM

## 2022-11-22 ENCOUNTER — Other Ambulatory Visit: Payer: Self-pay | Admitting: Psychiatry

## 2022-11-25 ENCOUNTER — Other Ambulatory Visit: Payer: Self-pay | Admitting: Psychiatry

## 2022-12-29 NOTE — Progress Notes (Signed)
BH MD/PA/NP OP Progress Note  01/02/2023 11:18 AM Craig Andrade  MRN:  [id number]  Chief Complaint:  Chief Complaint  Patient presents with   Follow-up   HPI:  This is a follow-up appointment for depression and ADHD.  He states that he is just back from [place] for his job interview.  He had to stay in the airport due to recent issues with computer.  He thinks he went well, although he is unsure if he will take this job if it is offered.  He has been doing well at work.  His boss had as being very helpful and he is doing less tasks of those he was not interested.  He started to enjoy being around with his friends, and will attend contest for a rock climbing.  His anxiety has been much less, and he agrees that he feels almost like himself.  He sleeps well.  He has good appetite. He denies SI.  He has good concentration as long as he is on Ritalin.  He denies alcohol use or drug use.  Although he experiences sexual side effect from venlafaxine, he is not in the relationship, and would like to stay on this medication.  He has been able to take medication consistently by placing the bottle on top of his keys.   Wt Readings from Last 3 Encounters:  01/02/23 198 lb 9.6 oz (90.1 kg)  11/12/22 197 lb 6.4 oz (89.5 kg)  10/02/22 199 lb (90.3 kg)    Visit Diagnosis:    ICD-10-CM   1. MDD (major depressive disorder), recurrent, in partial remission (HCC)  F33.41 venlafaxine XR (EFFEXOR-XR) 150 MG 24 hr capsule    2. Attention deficit hyperactivity disorder (ADHD), combined type, moderate  F90.2       Past Psychiatric History: Please see initial evaluation for full details. I have reviewed the history. No updates at this time.     Past Medical History: History reviewed. No pertinent past medical history. History reviewed. No pertinent surgical history.  Family Psychiatric History: Please see initial evaluation for full details. I have reviewed the history. No updates at this time.     Family  History:  Family History  Problem Relation Age of Onset   Depression Mother    Healthy Mother    Healthy Father    Anxiety disorder Brother     Social History:  Social History   Socioeconomic History   Marital status: Single    Spouse name: Not on file   Number of children: Not on file   Years of education: Not on file   Highest education level: Not on file  Occupational History   Not on file  Tobacco Use   Smoking status: Never   Smokeless tobacco: Never  Vaping Use   Vaping status: Never Used  Substance and Sexual Activity   Alcohol use: Yes    Alcohol/week: 1.0 standard drink of alcohol    Types: 1 Glasses of wine per week    Comment: drinks no more than 2 beers on occassion.    Drug use: Never    Comment: No illicit drug use   Sexual activity: Not Currently  Other Topics Concern   Not on file  Social History Narrative   Not on file   Social Determinants of Health   Financial Resource Strain: Low Risk  (07/17/2021)   Overall Financial Resource Strain (CARDIA)    Difficulty of Paying Living Expenses: Not hard at all  Food Insecurity: No  Food Insecurity (07/17/2021)   Hunger Vital Sign    Worried About Running Out of Food in the Last Year: Never true    Ran Out of Food in the Last Year: Never true  Transportation Needs: No Transportation Needs (07/17/2021)   PRAPARE - Administrator, Civil Service (Medical): No    Lack of Transportation (Non-Medical): No  Physical Activity: Sufficiently Active (07/17/2021)   Exercise Vital Sign    Days of Exercise per Week: 5 days    Minutes of Exercise per Session: 60 min  Stress: Stress Concern Present (07/17/2021)   [employer] of Occupational Health - Occupational Stress Questionnaire    Feeling of Stress : To some extent  Social Connections: Socially Isolated (07/17/2021)   Social Connection and Isolation Panel [NHANES]    Frequency of Communication with Friends and Family: Once a week    Frequency of Social  Gatherings with Friends and Family: Three times a week    Attends Religious Services: Never    Active Member of Clubs or Organizations: No    Attends Banker Meetings: Never    Marital Status: Never married    Allergies: No Known Allergies  Metabolic Disorder Labs: No results found for: "HGBA1C", "MPG" No results found for: "PROLACTIN" Lab Results  Component Value Date   CHOL 200 05/29/2022   TRIG 71.0 05/29/2022   HDL 56.50 05/29/2022   CHOLHDL 4 05/29/2022   VLDL 14.2 05/29/2022   LDLCALC 129 (H) 05/29/2022   LDLCALC 131 (H) 05/16/2021   Lab Results  Component Value Date   TSH 2.31 02/04/2019    Therapeutic Level Labs: No results found for: "LITHIUM" No results found for: "VALPROATE" No results found for: "CBMZ"  Current Medications: Current Outpatient Medications  Medication Sig Dispense Refill   celecoxib (CELEBREX) 200 MG capsule Take 1 capsule (200 mg total) by mouth 2 (two) times daily. 42 capsule 0   methylphenidate (RITALIN) 20 MG tablet Take 1 tablet (20 mg total) by mouth 2 (two) times daily. 60 tablet 0   [START ON 02/01/2023] methylphenidate (RITALIN) 20 MG tablet Take 1 tablet (20 mg total) by mouth 2 (two) times daily. 60 tablet 0   venlafaxine XR (EFFEXOR-XR) 150 MG 24 hr capsule Take 1 capsule (150 mg total) by mouth daily. 90 capsule 0   No current facility-administered medications for this visit.     Musculoskeletal: Strength & Muscle Tone: within normal limits Gait & Station: normal Patient leans: N/A  Psychiatric Specialty Exam: Review of Systems  Blood pressure 134/83, pulse 76, temperature 97.6 F (36.4 C), temperature source Skin, height 5\' 11"  (1.803 m), weight 198 lb 9.6 oz (90.1 kg).Body mass index is 27.7 kg/m.  General Appearance: Fairly Groomed  Eye Contact:  Good  Speech:  Clear and Coherent  Volume:  Normal  Mood:   good  Affect:  Appropriate, Congruent, and calm  Thought Process:  Coherent  Orientation:  Full  (Time, Place, and Person)  Thought Content: Logical   Suicidal Thoughts:  No  Homicidal Thoughts:  No  Memory:  Immediate;   Good  Judgement:  Good  Insight:  Good  Psychomotor Activity:  Normal  Concentration:  Concentration: Good and Attention Span: Good  Recall:  Good  Fund of Knowledge: Good  Language: Good  Akathisia:  No  Handed:  Right  AIMS (if indicated): not done  Assets:  Communication Skills Desire for Improvement  ADL's:  Intact  Cognition: WNL  Sleep:  Good  Screenings: GAD-7    Flowsheet Row Office Visit from 11/12/2022 in [facility] Office Visit from 07/22/2022 in [facility] Office Visit from 05/30/2022 in [facility] Office Visit from 05/22/2022 in [facility] Visit from 03/18/2022 in [facility]  Total GAD-7 Score 16 6 15 12 13       PHQ2-9    Flowsheet Row Office Visit from 01/02/2023 in [facility] Office Visit from 11/12/2022 in [facility] Office Visit from 07/22/2022 in [facility] Office Visit from 05/30/2022 in [facility] Office Visit from 05/22/2022 in [facility]  PHQ-2 Total Score 0 6 2 6 6   PHQ-9 Total Score -- 20 6 20 19       Flowsheet Row Office Visit from 07/22/2022 in [facility] Most recent reading at 07/22/2022  1:38 PM Video Visit from 07/17/2021 in [facility] Most recent reading at 07/17/2021  4:19 PM Counselor from 07/17/2021 in [facility] Most recent reading at 07/17/2021 10:07 AM  C-SSRS RISK CATEGORY No Risk No Risk No Risk         Assessment and Plan:  Craig Andrade is a 32 y.o. year old male with a history of depression, anxiety, who presents for follow up appointment for below.   1. MDD (major depressive disorder), recurrent, in partial remission (HCC) Acute stressors include: work related stress  Other stressors include:    History:   Significant improvement in depressive symptoms since switching back to venlafaxine.  Although he experiences sexual dysfunction from the medication, he prefers to stay on this medication at this time.  Noted that he has been on a lower dose than recommended, he reports significant benefit and denies any significant mood symptoms.  Will continue current dose to target depression.   2. Attention deficit hyperactivity disorder (ADHD), combined type, moderate -Neuropsychological testing in May 2023 was consistent with ADHD. Utox 07/2021 negative  Significant improvement as his mood improves.  Will continue current dose of Ritalin to target ADHD.    # Insomnia Improving.  Referral was made for evaluation of sleep apnea due to history of daytime fatigue and snoring.  Will revisit this issue at the next visit.    Plan Continue venlafaxine 150 mg daily Continue Ritalin 20 mg twice a day  Next appointment: 9/19 at 11 AM Referred to therapy (onsite) Referred for evaluation of sleep apnea   Past trials of medication: lexapro, fluoxetine (limited benefit), venlafaxine, bupropion, Ritalin (felt spacy), Concerta, Focalin, Adderall, Vyvanse, Strattera (he had some side effect from stimulant, including nausea.)   The patient demonstrates the following risk factors for suicide: Chronic risk factors for suicide include: psychiatric disorder of depression . Acute risk factors for suicide include: N/A. Protective factors for this patient include: coping skills and hope for the future. Considering these factors, the overall suicide risk at this point appears to be low. Patient is appropriate for  outpatient follow up.   Collaboration of Care: Collaboration of Care: Other reviewed notes in Epic  Patient/Guardian was advised Release of Information must be obtained prior to any record release in order to collaborate their care with an outside provider. Patient/Guardian was advised if they have not already done so to contact the  registration department to sign all necessary forms in order for [place] to release information regarding their care.   Consent: Patient/Guardian gives verbal consent for treatment and assignment of benefits for services provided during this visit. Patient/Guardian expressed understanding and agreed to proceed.    Neysa Hotter, MD 01/02/2023, 11:18 AM

## 2023-01-02 ENCOUNTER — Ambulatory Visit (INDEPENDENT_AMBULATORY_CARE_PROVIDER_SITE_OTHER): Payer: BC Managed Care – PPO | Admitting: Psychiatry

## 2023-01-02 ENCOUNTER — Encounter: Payer: Self-pay | Admitting: Psychiatry

## 2023-01-02 VITALS — BP 134/83 | HR 76 | Temp 97.6°F | Ht 71.0 in | Wt 198.6 lb

## 2023-01-02 DIAGNOSIS — F3341 Major depressive disorder, recurrent, in partial remission: Secondary | ICD-10-CM

## 2023-01-02 DIAGNOSIS — F902 Attention-deficit hyperactivity disorder, combined type: Secondary | ICD-10-CM | POA: Diagnosis not present

## 2023-01-02 MED ORDER — VENLAFAXINE HCL ER 150 MG PO CP24
150.0000 mg | ORAL_CAPSULE | Freq: Every day | ORAL | 0 refills | Status: DC
Start: 2023-01-02 — End: 2023-04-03

## 2023-01-02 MED ORDER — METHYLPHENIDATE HCL 20 MG PO TABS: 20.0000 mg | ORAL_TABLET | Freq: Two times a day (BID) | ORAL | 0 refills | Status: DC

## 2023-01-02 NOTE — Patient Instructions (Signed)
Continue venlafaxine 150 mg daily Continue Ritalin 20 mg twice a day  Next appointment: 9/19 at 11 AM

## 2023-01-12 ENCOUNTER — Other Ambulatory Visit: Payer: Self-pay | Admitting: Psychiatry

## 2023-02-17 IMAGING — DX DG SACRUM/COCCYX 2+V
3 series · 3 of 3 positions shown · non-contrast
Comparison: None.

CLINICAL DATA: Atraumatic sacrum/coccyx pain.

EXAM:
SACRUM AND COCCYX - 2+ VIEW

[sacrum 20° caudo-cranial ap]
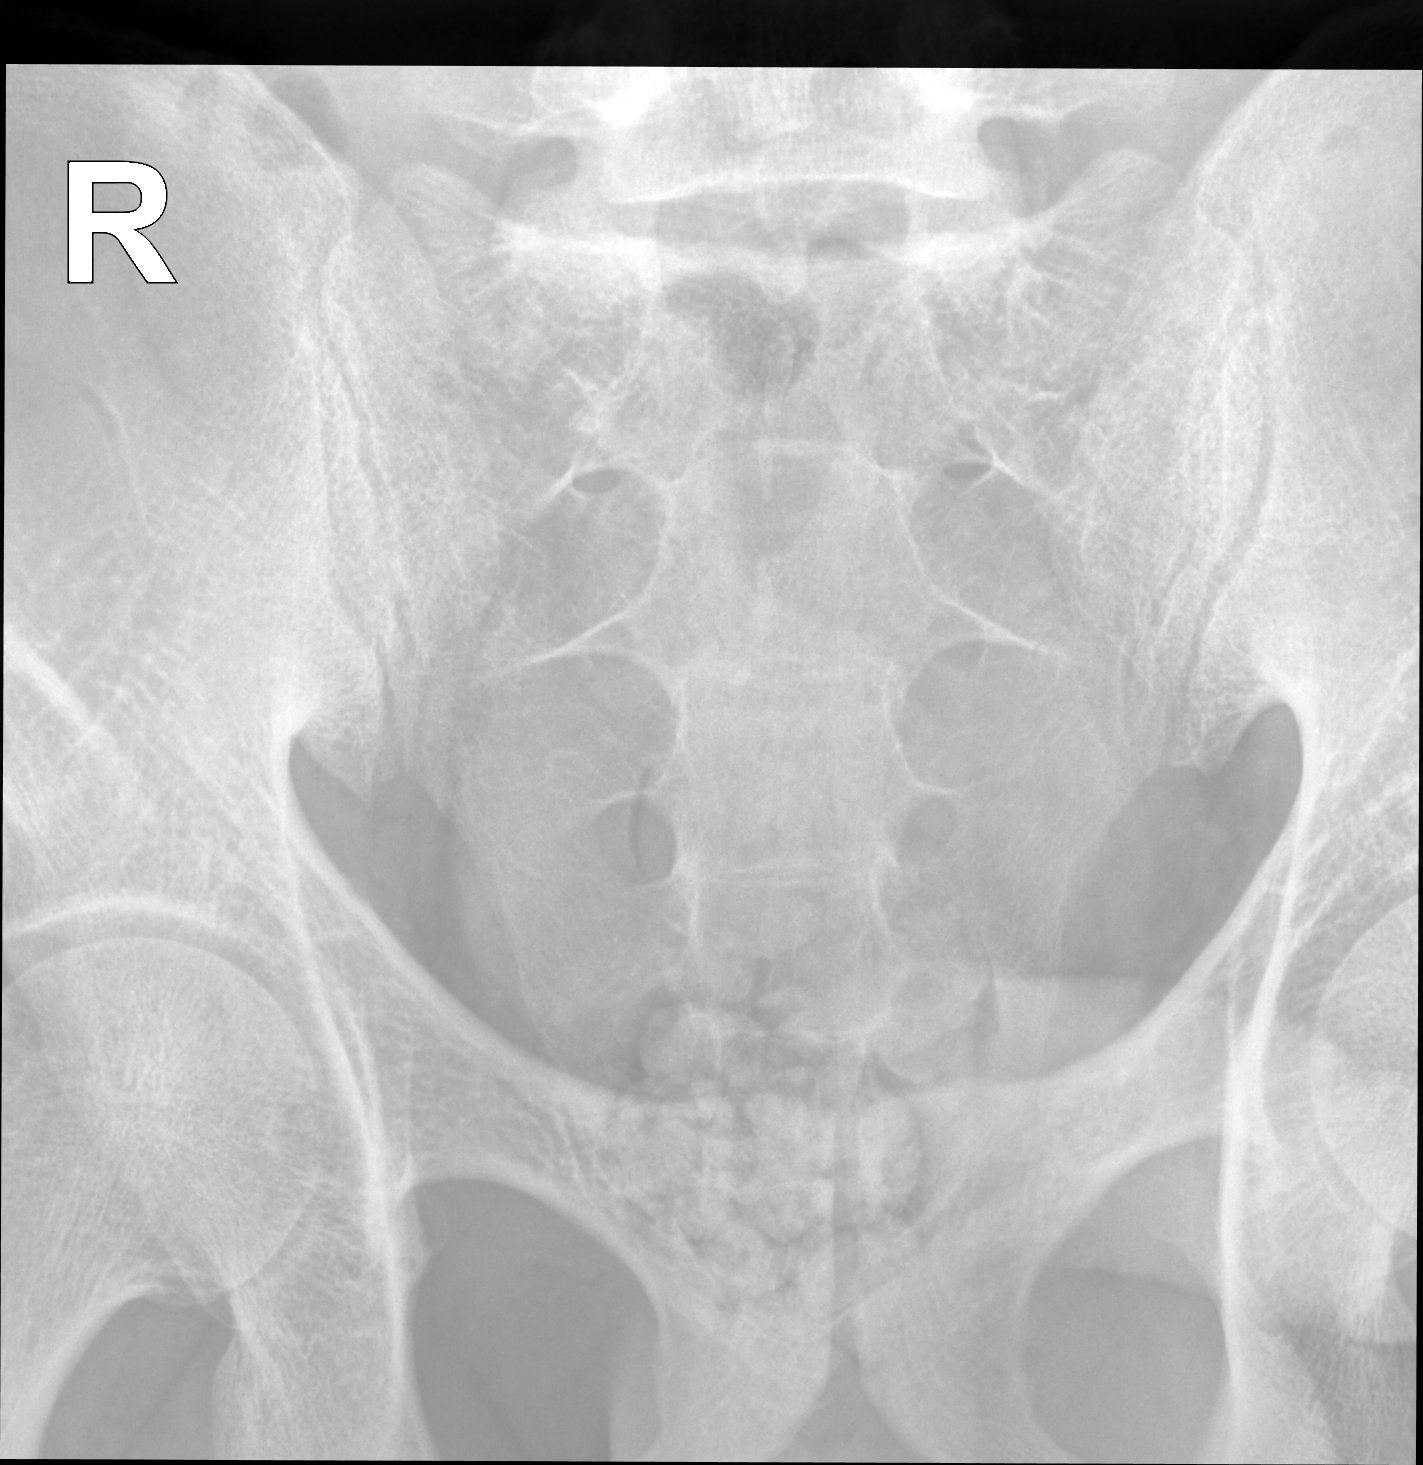

[sacrum lat]
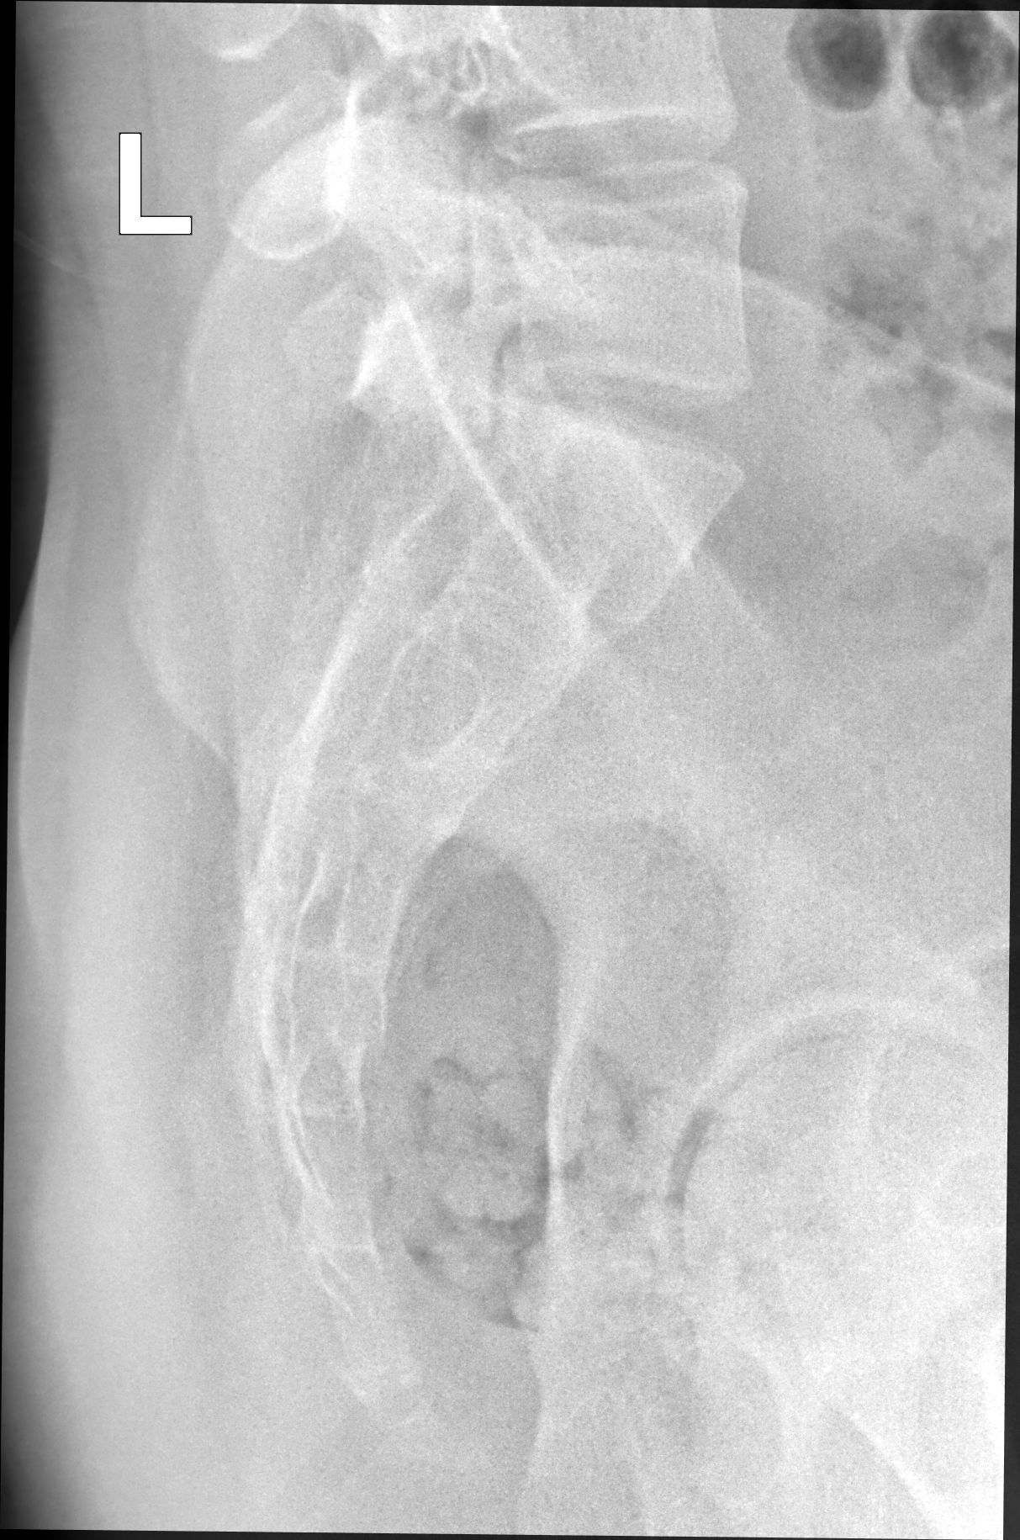

[coccyx ap]
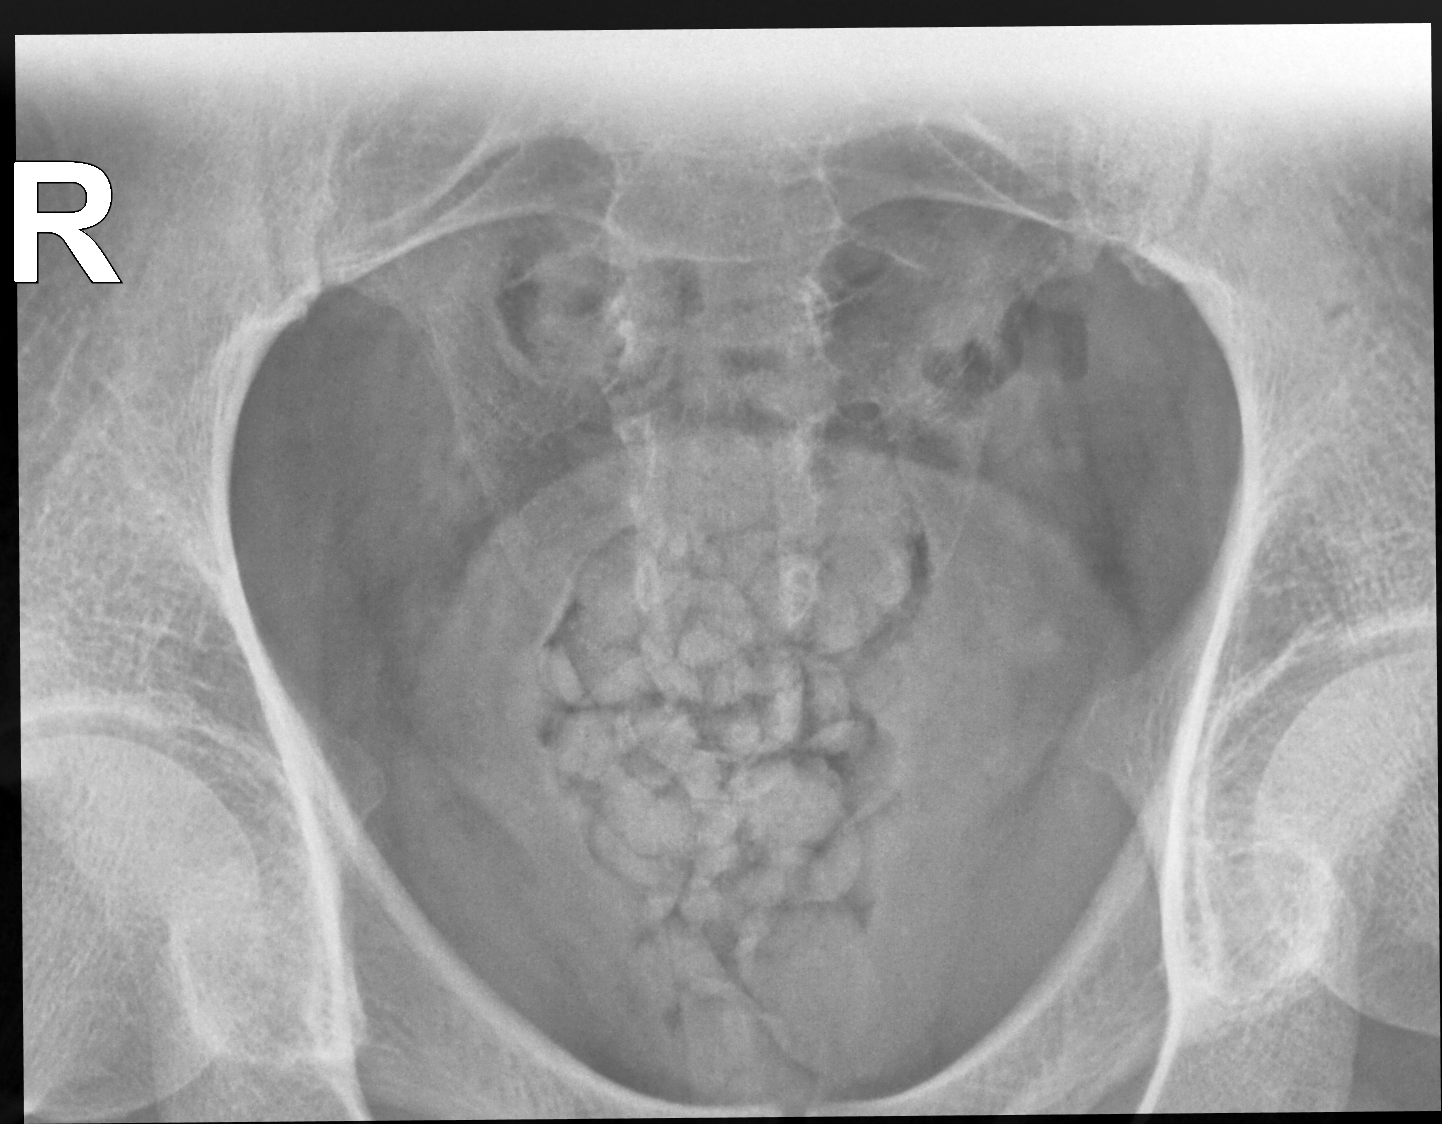

[3 of 3 positions shown; findings below may reference images not displayed]

FINDINGS: There is no evidence of fracture or other focal bone lesions.
IMPRESSION: Negative.

## 2023-02-23 NOTE — Progress Notes (Unsigned)
BH MD/PA/NP OP Progress Note  02/27/2023 11:44 AM Craig Andrade  MRN:  [id number]  Chief Complaint:  Chief Complaint  Patient presents with   Follow-up   HPI:  This is a follow-up appointment for depression, ADHD.  He states that he feels down half times of the week.  He sleeps in the bed.  He tries to make himself do things.  He enjoys doing well climbing.  He will be smiling and laughing after about 30 minutes getting there.  However, he feels miserable once he leaves.  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  Although he wants to find a job, he has no motivation to do so.  He has started to eat junk food, and binge eating at times. The patient has mood symptoms as in PHQ-9/GAD-7. He denies SI.  He denies alcohol use or drug use.  Although he reached out to therapist to inquire establishing care, he has only one reply. He knows he needs a therapy, and he agrees to continue searching the one.   Wt Readings from Last 3 Encounters:  02/27/23 198 lb (89.8 kg)  01/02/23 198 lb 9.6 oz (90.1 kg)  11/12/22 197 lb 6.4 oz (89.5 kg)     Visit Diagnosis:    ICD-10-CM   1. MDD (major depressive disorder), recurrent episode, moderate (HCC)  F33.1     2. Attention deficit hyperactivity disorder (ADHD), combined type, moderate  F90.2       Past Psychiatric History: Please see initial evaluation for full details. I have reviewed the history. No updates at this time.     Past Medical History: History reviewed. No pertinent past medical history. History reviewed. No pertinent surgical history.  Family Psychiatric History: Please see initial evaluation for full details. I have reviewed the history. No updates at this time.     Family History:  Family History  Problem Relation Age of Onset   Depression Mother    Healthy Mother    Healthy Father    Anxiety disorder Brother     Social History:  Social History   Socioeconomic History   Marital status:  Single    Spouse name: Not on file   Number of children: Not on file   Years of education: Not on file   Highest education level: Not on file  Occupational History   Not on file  Tobacco Use   Smoking status: Never   Smokeless tobacco: Never  Vaping Use   Vaping status: Never Used  Substance and Sexual Activity   Alcohol use: Yes    Alcohol/week: 1.0 standard drink of alcohol    Types: 1 Glasses of wine per week    Comment: drinks no more than 2 beers on occassion.    Drug use: Never    Comment: No illicit drug use   Sexual activity: Not Currently  Other Topics Concern   Not on file  Social History Narrative   Not on file   Social Determinants of Health   Financial Resource Strain: Low Risk  (07/17/2021)   Overall Financial Resource Strain (CARDIA)    Difficulty of Paying Living Expenses: Not hard at all  Food Insecurity: No Food Insecurity (07/17/2021)   Hunger Vital Sign    Worried About Running Out of Food in the Last Year: Never true    Ran Out of Food in the Last Year: Never true  Transportation Needs: No Transportation Needs (07/17/2021)   PRAPARE - Transportation  Lack of Transportation (Medical): No    Lack of Transportation (Non-Medical): No  Physical Activity: Sufficiently Active (07/17/2021)   Exercise Vital Sign    Days of Exercise per Week: 5 days    Minutes of Exercise per Session: 60 min  Stress: Stress Concern Present (07/17/2021)   [employer] of Occupational Health - Occupational Stress Questionnaire    Feeling of Stress : To some extent  Social Connections: Socially Isolated (07/17/2021)   Social Connection and Isolation Panel [NHANES]    Frequency of Communication with Friends and Family: Once a week    Frequency of Social Gatherings with Friends and Family: Three times a week    Attends Religious Services: Never    Active Member of Clubs or Organizations: No    Attends Banker Meetings: Never    Marital Status: Never married     Allergies: No Known Allergies  Metabolic Disorder Labs: No results found for: "HGBA1C", "MPG" No results found for: "PROLACTIN" Lab Results  Component Value Date   CHOL 200 05/29/2022   TRIG 71.0 05/29/2022   HDL 56.50 05/29/2022   CHOLHDL 4 05/29/2022   VLDL 14.2 05/29/2022   LDLCALC 129 (H) 05/29/2022   LDLCALC 131 (H) 05/16/2021   Lab Results  Component Value Date   TSH 2.31 02/04/2019    Therapeutic Level Labs: No results found for: "LITHIUM" No results found for: "VALPROATE" No results found for: "CBMZ"  Current Medications: Current Outpatient Medications  Medication Sig Dispense Refill   celecoxib (CELEBREX) 200 MG capsule Take 1 capsule (200 mg total) by mouth 2 (two) times daily. 42 capsule 0   venlafaxine XR (EFFEXOR-XR) 150 MG 24 hr capsule Take 1 capsule (150 mg total) by mouth daily. 90 capsule 0   venlafaxine XR (EFFEXOR-XR) 75 MG 24 hr capsule Take 1 capsule (75 mg total) by mouth daily with breakfast. Take total of 225 mg daily. Take along with 150 mg cap 30 capsule 1   methylphenidate (RITALIN) 20 MG tablet Take 1 tablet (20 mg total) by mouth 2 (two) times daily. 60 tablet 0   [START ON 03/29/2023] methylphenidate (RITALIN) 20 MG tablet Take 1 tablet (20 mg total) by mouth 2 (two) times daily. 60 tablet 0   No current facility-administered medications for this visit.     Musculoskeletal: Strength & Muscle Tone: within normal limits Gait & Station: normal Patient leans: N/A  Psychiatric Specialty Exam: Review of Systems  Psychiatric/Behavioral:  Positive for dysphoric mood. Negative for agitation, behavioral problems, confusion, decreased concentration, hallucinations, self-injury, sleep disturbance and suicidal ideas. The patient is nervous/anxious. The patient is not hyperactive.   All other systems reviewed and are negative.   Blood pressure 133/86, pulse 92, temperature (!) 97.3 F (36.3 C), temperature source Skin, height 5\' 11"  (1.803 m),  weight 198 lb (89.8 kg).Body mass index is 27.62 kg/m.  General Appearance: Well Groomed  Eye Contact:  Fair  Speech:  Clear and Coherent  Volume:  Normal  Mood:  Depressed  Affect:  Appropriate, Congruent, Restricted, and down  Thought Process:  Coherent  Orientation:  Full (Time, Place, and Person)  Thought Content: Logical   Suicidal Thoughts:  No  Homicidal Thoughts:  No  Memory:  Immediate;   Good  Judgement:  Good  Insight:  Good  Psychomotor Activity:  Normal  Concentration:  Concentration: Good and Attention Span: Good  Recall:  Good  Fund of Knowledge: Good  Language: Good  Akathisia:  No  Handed:  Right  AIMS (if indicated): not done  Assets:  Communication Skills Desire for Improvement  ADL's:  Intact  Cognition: WNL  Sleep:  Fair   Screenings: GAD-7    Flowsheet Row Office Visit from 11/12/2022 in [facility] Office Visit from 07/22/2022 in [facility] Office Visit from 05/30/2022 in [facility] Office Visit from 05/22/2022 in [facility] Visit from 03/18/2022 in [facility]  Total GAD-7 Score 16 6 15 12 13       PHQ2-9    Flowsheet Row Office Visit from 02/27/2023 in [facility] Office Visit from 01/02/2023 in [facility] Office Visit from 11/12/2022 in [facility] Office Visit from 07/22/2022 in [facility] Office Visit from 05/30/2022 in [facility]  PHQ-2 Total Score 4 0 6 2 6   PHQ-9 Total Score 11 -- 20 6 20       Flowsheet Row Office Visit from 07/22/2022 in [facility] Most recent reading at 07/22/2022  1:38 PM  Video Visit from 07/17/2021 in [facility] Most recent reading at 07/17/2021  4:19 PM Counselor from 07/17/2021 in [facility] Most recent reading at 07/17/2021 10:07 AM  C-SSRS RISK CATEGORY No Risk No Risk No Risk        Assessment and Plan:  Craig Andrade is a 32 y.o. year old male with a history of depression, anxiety, who presents for follow up appointment for below.   1. MDD (major depressive disorder), recurrent episode, moderate (HCC) Acute stressors include: work related stress  Other stressors include:    History:   There has been significant worsening in depressive symptoms since her last visit.  Will uptitrate venlafaxine to optimize treatment for depression.  He will greatly benefit from CBT.  Resources are slightly limited due to the hope of therapy outside of office hours.  Will continue to search.   2. Attention deficit hyperactivity disorder (ADHD), combined type, moderate -Neuropsychological testing in May 2023 was consistent with ADHD. Utox 07/2021 negative  He reports significant benefit from the current medication regimen.  Will continue current dose of Ritalin to target ADHD.    # Insomnia Improving. Referral was made for evaluation of sleep apnea due to history of daytime fatigue and snoring.  Will revisit this issue at the next visit.    Plan Continue venlafaxine 150 mg daily Continue Ritalin 20 mg twice a day  Next appointment: 11/14 at 8 am, IP Referred to therapy (onsite) Referred for evaluation of sleep apnea   Past trials of medication: lexapro, fluoxetine (limited benefit), venlafaxine, bupropion, Ritalin (felt spacy), Concerta, Focalin, Adderall, Vyvanse, Strattera (he had some side effect from stimulant, including nausea.)   The patient demonstrates the following risk factors for suicide: Chronic risk factors for suicide include: psychiatric disorder of depression . Acute risk factors for  suicide include: N/A. Protective factors for this patient include: coping skills and hope for the future. Considering these factors, the overall suicide risk at this point appears to be low. Patient is appropriate for outpatient follow up.   Collaboration of Care: Collaboration of Care: Other reviewed notes in Epic  Patient/Guardian was advised Release of Information must be obtained prior to any record release in order to collaborate their care with  an outside provider. Patient/Guardian was advised if they have not already done so to contact the registration department to sign all necessary forms in order for [place] to release information regarding their care.   Consent: Patient/Guardian gives verbal consent for treatment and assignment of benefits for services provided during this visit. Patient/Guardian expressed understanding and agreed to proceed.    Neysa Hotter, MD 02/27/2023, 11:44 AM

## 2023-02-27 ENCOUNTER — Encounter: Payer: Self-pay | Admitting: Psychiatry

## 2023-02-27 ENCOUNTER — Ambulatory Visit (INDEPENDENT_AMBULATORY_CARE_PROVIDER_SITE_OTHER): Payer: BC Managed Care – PPO | Admitting: Psychiatry

## 2023-02-27 VITALS — BP 133/86 | HR 92 | Temp 97.3°F | Ht 71.0 in | Wt 198.0 lb

## 2023-02-27 DIAGNOSIS — F331 Major depressive disorder, recurrent, moderate: Secondary | ICD-10-CM

## 2023-02-27 DIAGNOSIS — F902 Attention-deficit hyperactivity disorder, combined type: Secondary | ICD-10-CM

## 2023-02-27 MED ORDER — METHYLPHENIDATE HCL 20 MG PO TABS: 20.0000 mg | ORAL_TABLET | Freq: Two times a day (BID) | ORAL | 0 refills | Status: AC

## 2023-02-27 MED ORDER — METHYLPHENIDATE HCL 20 MG PO TABS: 20.0000 mg | ORAL_TABLET | Freq: Two times a day (BID) | ORAL | 0 refills | Status: DC

## 2023-02-27 MED ORDER — VENLAFAXINE HCL ER 75 MG PO CP24: 75.0000 mg | ORAL_CAPSULE | Freq: Every day | ORAL | 1 refills | Status: DC

## 2023-02-28 ENCOUNTER — Telehealth: Payer: Self-pay | Admitting: Psychiatry

## 2023-02-28 ENCOUNTER — Telehealth: Payer: Self-pay

## 2023-02-28 ENCOUNTER — Other Ambulatory Visit: Payer: Self-pay | Admitting: Psychiatry

## 2023-02-28 NOTE — Telephone Encounter (Signed)
Left a voice message. The therapy practice is called [facility]. I advised him to visit their website and to contact us if he's interested in the referral. I also informed him that I will be on leave next week, but covering providers will assist with any urgent needs. (please send a referral if he is interested in pursing this.)

## 2023-02-28 NOTE — Telephone Encounter (Signed)
pt states he returninng your call about if he was interested in a referral. he states he is. wanted to speak with you about it. pt was last seen on 9-19 next appt 11-14

## 2023-02-28 NOTE — Telephone Encounter (Signed)
Called the patient and provided a possible therapy referral option [facility]), which includes both group and individual therapy outside of regular office hours. Advised the patient to contact the office if they are interested or have any further questions

## 2023-03-21 ENCOUNTER — Other Ambulatory Visit: Payer: Self-pay | Admitting: Psychiatry

## 2023-04-03 ENCOUNTER — Other Ambulatory Visit: Payer: Self-pay | Admitting: Psychiatry

## 2023-04-03 DIAGNOSIS — F3341 Major depressive disorder, recurrent, in partial remission: Secondary | ICD-10-CM

## 2023-04-17 NOTE — Progress Notes (Signed)
BH MD/PA/NP OP Progress Note  04/24/2023 8:35 AM Craig Andrade  MRN:  [id number]  Chief Complaint:  Chief Complaint  Patient presents with   Follow-up   HPI:  This is a follow-up appointment for depression, ADHD.  He states that he is offered a job for [employer].  He was contacted, and is currently waiting for the official offer.  He will be moving to [place], hopefully next January.  He feels very excited about this.  He has been going rock climbing, being with his friends.  However, on further evaluation, he states that he continues to struggle with cleaning an apartment,cooking dinner, and keeping her personal hygiene.  He feels checked out and do not care about those things.  He does not have good motivation to do this, although he denies feeling exhausted. The patient has mood symptoms as in PHQ-9/GAD-7. He denies SI.  He has not noticed much difference from uptitration of venlafaxine.  Although he initially reports his preference to stay on the medication, he agrees to try adjunctive treatment after being provided psychoeducation.   Wt Readings from Last 3 Encounters:  04/24/23 203 lb 6.4 oz (92.3 kg)  02/27/23 198 lb (89.8 kg)  01/02/23 198 lb 9.6 oz (90.1 kg)     Visit Diagnosis:    ICD-10-CM   1. MDD (major depressive disorder), recurrent episode, mild (HCC)  F33.0     2. Attention deficit hyperactivity disorder (ADHD), combined type, moderate  F90.2       Past Psychiatric History: Please see initial evaluation for full details. I have reviewed the history. No updates at this time.     Past Medical History: History reviewed. No pertinent past medical history. History reviewed. No pertinent surgical history.  Family Psychiatric History: Please see initial evaluation for full details. I have reviewed the history. No updates at this time.     Family History:  Family History  Problem Relation Age of Onset   Depression Mother    Healthy Mother    Healthy Father    Anxiety  disorder Brother     Social History:  Social History   Socioeconomic History   Marital status: Single    Spouse name: Not on file   Number of children: Not on file   Years of education: Not on file   Highest education level: Not on file  Occupational History   Not on file  Tobacco Use   Smoking status: Never   Smokeless tobacco: Never  Vaping Use   Vaping status: Never Used  Substance and Sexual Activity   Alcohol use: Yes    Alcohol/week: 1.0 standard drink of alcohol    Types: 1 Glasses of wine per week    Comment: drinks no more than 2 beers on occassion.    Drug use: Never    Comment: No illicit drug use   Sexual activity: Not Currently  Other Topics Concern   Not on file  Social History Narrative   Not on file   Social Determinants of Health   Financial Resource Strain: Low Risk  (07/17/2021)   Overall Financial Resource Strain (CARDIA)    Difficulty of Paying Living Expenses: Not hard at all  Food Insecurity: No Food Insecurity (07/17/2021)   Hunger Vital Sign    Worried About Running Out of Food in the Last Year: Never true    Ran Out of Food in the Last Year: Never true  Transportation Needs: No Transportation Needs (07/17/2021)   PRAPARE -  Administrator, Civil Service (Medical): No    Lack of Transportation (Non-Medical): No  Physical Activity: Sufficiently Active (07/17/2021)   Exercise Vital Sign    Days of Exercise per Week: 5 days    Minutes of Exercise per Session: 60 min  Stress: Stress Concern Present (07/17/2021)   [employer] of Occupational Health - Occupational Stress Questionnaire    Feeling of Stress : To some extent  Social Connections: Socially Isolated (07/17/2021)   Social Connection and Isolation Panel [NHANES]    Frequency of Communication with Friends and Family: Once a week    Frequency of Social Gatherings with Friends and Family: Three times a week    Attends Religious Services: Never    Active Member of Clubs or  Organizations: No    Attends Banker Meetings: Never    Marital Status: Never married    Allergies: No Known Allergies  Metabolic Disorder Labs: No results found for: "HGBA1C", "MPG" No results found for: "PROLACTIN" Lab Results  Component Value Date   CHOL 200 05/29/2022   TRIG 71.0 05/29/2022   HDL 56.50 05/29/2022   CHOLHDL 4 05/29/2022   VLDL 14.2 05/29/2022   LDLCALC 129 (H) 05/29/2022   LDLCALC 131 (H) 05/16/2021   Lab Results  Component Value Date   TSH 2.31 02/04/2019    Therapeutic Level Labs: No results found for: "LITHIUM" No results found for: "VALPROATE" No results found for: "CBMZ"  Current Medications: Current Outpatient Medications  Medication Sig Dispense Refill   ARIPiprazole (ABILIFY) 2 MG tablet Take 1 tablet (2 mg total) by mouth at bedtime. 90 tablet 0   celecoxib (CELEBREX) 200 MG capsule Take 1 capsule (200 mg total) by mouth 2 (two) times daily. 42 capsule 0   methylphenidate (RITALIN) 20 MG tablet Take 1 tablet (20 mg total) by mouth 2 (two) times daily. 60 tablet 0   venlafaxine XR (EFFEXOR-XR) 150 MG 24 hr capsule Take 1 capsule (150 mg total) by mouth daily. Take total of 225 mg daily. Take along with 75 mg cap 90 capsule 1   venlafaxine XR (EFFEXOR-XR) 75 MG 24 hr capsule Take 1 capsule (75 mg total) by mouth daily with breakfast. Take total of 225 mg daily. Take along with 150 mg cap 90 capsule 1   [START ON 05/24/2023] methylphenidate (RITALIN) 20 MG tablet Take 1 tablet (20 mg total) by mouth 2 (two) times daily. 60 tablet 0   No current facility-administered medications for this visit.     Musculoskeletal: Strength & Muscle Tone: within normal limits Gait & Station: normal Patient leans: N/A  Psychiatric Specialty Exam: Review of Systems  Psychiatric/Behavioral:  Negative for agitation, behavioral problems, confusion, decreased concentration, dysphoric mood, hallucinations, self-injury, sleep disturbance and suicidal  ideas. The patient is not nervous/anxious and is not hyperactive.   All other systems reviewed and are negative.   Blood pressure 121/77, pulse 89, temperature 97.8 F (36.6 C), temperature source Skin, height 5\' 11"  (1.803 m), weight 203 lb 6.4 oz (92.3 kg).Body mass index is 28.37 kg/m.  General Appearance: Well Groomed  Eye Contact:  Good  Speech:  Clear and Coherent  Volume:  Normal  Mood:   good  Affect:  Appropriate, Congruent, and brighter, but fatigued  Thought Process:  Coherent  Orientation:  Full (Time, Place, and Person)  Thought Content: Logical   Suicidal Thoughts:  No  Homicidal Thoughts:  No  Memory:  Immediate;   Good  Judgement:  Good  Insight:  Good  Psychomotor Activity:  Normal  Concentration:  Concentration: Good and Attention Span: Good  Recall:  Good  Fund of Knowledge: Good  Language: Good  Akathisia:  No  Handed:  Right  AIMS (if indicated): not done  Assets:  Communication Skills Desire for Improvement  ADL's:  Intact  Cognition: WNL  Sleep:  Good   Screenings: GAD-7    Flowsheet Row Office Visit from 04/24/2023 in [facility] Office Visit from 11/12/2022 in [facility] Office Visit from 07/22/2022 in [facility] Office Visit from 05/30/2022 in [facility] Office Visit from 05/22/2022 in [facility]  Total GAD-7 Score 9 16 6 15 12       PHQ2-9    Flowsheet Row Office Visit from 04/24/2023 in [facility] Office Visit from 02/27/2023 in [facility] Office Visit from 01/02/2023 in [facility] Office Visit from 11/12/2022 in [facility] Office Visit from 07/22/2022 in [facility]  PHQ-2 Total Score 3 4 0 6 2  PHQ-9 Total Score 6 11 -- 20 6      Flowsheet Row Office Visit from 07/22/2022 in [facility] Most recent reading at 07/22/2022  1:38 PM Video Visit from 07/17/2021 in [facility] Most recent reading at 07/17/2021  4:19 PM Counselor from 07/17/2021 in [facility] Most recent reading at 07/17/2021 10:07 AM  C-SSRS RISK CATEGORY No Risk No Risk No Risk        Assessment and Plan:  JERSEN NULTY is a 32 y.o. year old male with a history of depression, anxiety, who presents for follow up appointment for below.    1. MDD (major depressive disorder), recurrent episode, mild (HCC) Acute stressors include: work related stress  Other stressors include:    History:   Although there has been overall improvement in depressive symptoms in the context of being offered a new job, he continues to struggle with his daily function such as taking care of personal hygiene.  He reports limited benefit from that recent uptitration of venlafaxine.  Will add Abilify adjunctive treatment for depression.  Discussed potential metabolic side effect, EPS, QTc prolongation.  Will continue venlafaxine to target depression and anxiety.   2. Attention deficit hyperactivity disorder (ADHD), combined type, moderate -Neuropsychological testing in May 2023 was consistent with ADHD. Utox 07/2021 negative   Overall stable.  He reports significant benefit from the current medication regimen.  Will continue current dose of Ritalin to target ADHD.    # Insomnia Improving. Referral was made for evaluation of sleep apnea due to history of daytime fatigue and snoring.  Will revisit this issue at the next visit.    Plan Continue venlafaxine 225 mg daily Start Abilify 2 mg at night ( EKG QTc 428 msec 05/2022 Continue Ritalin 20 mg twice a day - a refill left Next  appointment: 1/13 at 8:30 for 30 mins, IP (waitlist for sooner appointment) Consider contacting [facility] for group therapy Referred for evaluation of sleep apnea  He states that he might move to [place] in early January.  Although the hope is to see him another time before he moves out, he expressed understanding to establish care with primary care at least to  continue his medication and care.     AlthoughPast trials of medication: lexapro, fluoxetine (limited benefit), venlafaxine, bupropion, Ritalin (felt spacy), Concerta, Focalin, Adderall, Vyvanse, Strattera (he had some side effect from stimulant, including nausea.)   The patient demonstrates the following risk factors for suicide: Chronic risk factors for suicide include: psychiatric disorder of depression . Acute risk factors for suicide include: N/A. Protective factors for this patient include: coping skills and hope for the future. Considering these factors, the overall suicide risk at this point appears to be low. Patient is appropriate for outpatient follow up.   Collaboration of Care: Collaboration of Care: Other reviewed notes in Epic  Patient/Guardian was advised Release of Information must be obtained prior to any record release in order to collaborate their care with an outside provider. Patient/Guardian was advised if they have not already done so to contact the registration department to sign all necessary forms in order for [place] to release information regarding their care.   Consent: Patient/Guardian gives verbal consent for treatment and assignment of benefits for services provided during this visit. Patient/Guardian expressed understanding and agreed to proceed.    Neysa Hotter, MD 04/24/2023, 8:35 AM

## 2023-04-24 ENCOUNTER — Ambulatory Visit (INDEPENDENT_AMBULATORY_CARE_PROVIDER_SITE_OTHER): Payer: BC Managed Care – PPO | Admitting: Psychiatry

## 2023-04-24 ENCOUNTER — Encounter: Payer: Self-pay | Admitting: Psychiatry

## 2023-04-24 VITALS — BP 121/77 | HR 89 | Temp 97.8°F | Ht 71.0 in | Wt 203.4 lb

## 2023-04-24 DIAGNOSIS — F902 Attention-deficit hyperactivity disorder, combined type: Secondary | ICD-10-CM | POA: Diagnosis not present

## 2023-04-24 DIAGNOSIS — F330 Major depressive disorder, recurrent, mild: Secondary | ICD-10-CM | POA: Diagnosis not present

## 2023-04-24 MED ORDER — METHYLPHENIDATE HCL 20 MG PO TABS: 20.0000 mg | ORAL_TABLET | Freq: Two times a day (BID) | ORAL | 0 refills | Status: AC

## 2023-04-24 MED ORDER — ARIPIPRAZOLE 2 MG PO TABS: 2.0000 mg | ORAL_TABLET | Freq: Every day | ORAL | 0 refills | Status: AC

## 2023-04-24 NOTE — Patient Instructions (Signed)
Continue venlafaxine 225 mg daily Start Abilify 2 mg at night  Continue Ritalin 20 mg twice a day Next appointment: 1/13 at 8:30 for 30 mins, IP Consider contacting [facility] for group therapy

## 2023-05-27 ENCOUNTER — Telehealth: Payer: Self-pay | Admitting: Family Medicine

## 2023-05-27 ENCOUNTER — Encounter: Payer: BC Managed Care – PPO | Admitting: Family Medicine

## 2023-05-27 NOTE — Telephone Encounter (Signed)
12.17.24 no show

## 2023-05-29 NOTE — Telephone Encounter (Signed)
1st no show, letter sent via mychart, sent text

## 2023-06-09 ENCOUNTER — Telehealth: Payer: Self-pay | Admitting: Psychiatry

## 2023-06-09 NOTE — Telephone Encounter (Signed)
Called to get patient a sooner appointment. He states he is moving to [place] and needs to cancel the 06-23-23 appointment. Wanted you to be aware

## 2023-06-09 NOTE — Telephone Encounter (Signed)
Noted, thanks!

## 2023-06-23 ENCOUNTER — Ambulatory Visit: Payer: BC Managed Care – PPO | Admitting: Psychiatry

## 2023-07-23 ENCOUNTER — Other Ambulatory Visit: Payer: Self-pay | Admitting: Psychiatry

## 8387-02-09 DEATH — deceased
